# Patient Record
Sex: Female | Born: 1945
Health system: Southern US, Community
[De-identification: ages and names within clinical notes are randomized; demographics above are authoritative.]

## PROBLEM LIST (undated history)

## (undated) DIAGNOSIS — N183 Chronic kidney disease, stage 3 unspecified: Secondary | ICD-10-CM

## (undated) DIAGNOSIS — C801 Malignant (primary) neoplasm, unspecified: Secondary | ICD-10-CM

## (undated) DIAGNOSIS — M810 Age-related osteoporosis without current pathological fracture: Secondary | ICD-10-CM

## (undated) DIAGNOSIS — E785 Hyperlipidemia, unspecified: Secondary | ICD-10-CM

## (undated) HISTORY — DX: Chronic kidney disease, stage 3 (moderate): N18.3

## (undated) HISTORY — DX: Hyperlipidemia, unspecified: E78.5

## (undated) HISTORY — DX: Age-related osteoporosis without current pathological fracture: M81.0

## (undated) HISTORY — PX: COLON SURGERY: SHX602

## (undated) HISTORY — DX: Malignant (primary) neoplasm, unspecified: C80.1

## (undated) HISTORY — PX: TONSILLECTOMY: SUR1361

## (undated) HISTORY — DX: Chronic kidney disease, stage 3 unspecified: N18.30

## (undated) HISTORY — PX: ABDOMINAL HYSTERECTOMY: SHX81

---

## 2011-07-02 ENCOUNTER — Emergency Department (HOSPITAL_COMMUNITY): Payer: Medicare Other

## 2011-07-02 ENCOUNTER — Emergency Department (HOSPITAL_COMMUNITY)
Admission: EM | Admit: 2011-07-02 | Discharge: 2011-07-02 | Disposition: A | Discharge status: Home / Self Care | Payer: Medicare Other | Attending: Emergency Medicine | Admitting: Emergency Medicine

## 2011-07-02 DIAGNOSIS — R112 Nausea with vomiting, unspecified: Secondary | ICD-10-CM | POA: Insufficient documentation

## 2011-07-02 DIAGNOSIS — R279 Unspecified lack of coordination: Secondary | ICD-10-CM | POA: Insufficient documentation

## 2011-07-02 DIAGNOSIS — R61 Generalized hyperhidrosis: Secondary | ICD-10-CM | POA: Insufficient documentation

## 2011-07-02 DIAGNOSIS — H81399 Other peripheral vertigo, unspecified ear: Secondary | ICD-10-CM | POA: Insufficient documentation

## 2011-07-02 LAB — POCT I-STAT, CHEM 8
Calcium, Ion: 1.08 mmol/L — ABNORMAL LOW (ref 1.12–1.32)
Creatinine, Ser: 1.2 mg/dL — ABNORMAL HIGH (ref 0.50–1.10)
Glucose, Bld: 147 mg/dL — ABNORMAL HIGH (ref 70–99)
Hemoglobin: 12.6 g/dL (ref 12.0–15.0)
TCO2: 21 mmol/L (ref 0–100)

## 2011-07-02 LAB — LIPID PANEL
Cholesterol: 204 mg/dL — ABNORMAL HIGH (ref 0–200)
HDL: 67 mg/dL (ref >39)
Triglycerides: 109 mg/dL (ref <150)

## 2011-07-02 LAB — CBC
HCT: 37.8 % (ref 36.0–46.0)
Hemoglobin: 12.7 g/dL (ref 12.0–15.0)
MCH: 26.7 pg (ref 26.0–34.0)
MCHC: 33.6 g/dL (ref 30.0–36.0)
MCV: 79.4 fL (ref 78.0–100.0)

## 2011-07-02 LAB — DIFFERENTIAL
Eosinophils Relative: 1 % (ref 0–5)
Lymphocytes Relative: 29 % (ref 12–46)
Monocytes Absolute: 0.5 10*3/uL (ref 0.1–1.0)
Monocytes Relative: 7 % (ref 3–12)
Neutro Abs: 4.9 10*3/uL (ref 1.7–7.7)

## 2011-07-02 LAB — PROTIME-INR: Prothrombin Time: 13.7 seconds (ref 11.6–15.2)

## 2011-07-02 LAB — POCT I-STAT TROPONIN I: Troponin i, poc: 0.01 ng/mL (ref 0.00–0.08)

## 2011-07-02 LAB — APTT: aPTT: 27 seconds (ref 24–37)

## 2011-07-02 LAB — HEMOGLOBIN A1C: Mean Plasma Glucose: 128 mg/dL — ABNORMAL HIGH (ref <117)

## 2013-01-13 ENCOUNTER — Ambulatory Visit (INDEPENDENT_AMBULATORY_CARE_PROVIDER_SITE_OTHER): Payer: Medicare Other | Admitting: Family Medicine

## 2013-01-13 ENCOUNTER — Encounter: Payer: Self-pay | Admitting: Family Medicine

## 2013-01-13 VITALS — BP 140/80 | HR 60 | Temp 97.6°F | Resp 14 | Wt 158.0 lb

## 2013-01-13 DIAGNOSIS — M25561 Pain in right knee: Secondary | ICD-10-CM

## 2013-01-13 DIAGNOSIS — H8110 Benign paroxysmal vertigo, unspecified ear: Secondary | ICD-10-CM | POA: Insufficient documentation

## 2013-01-13 DIAGNOSIS — M25569 Pain in unspecified knee: Secondary | ICD-10-CM

## 2013-01-13 MED ORDER — MELOXICAM 15 MG PO TABS: 15.0000 mg | ORAL_TABLET | Freq: Every day | ORAL | Status: DC
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
## 2014-07-19 ENCOUNTER — Encounter: Payer: Self-pay | Admitting: Family Medicine

## 2014-07-19 ENCOUNTER — Ambulatory Visit (INDEPENDENT_AMBULATORY_CARE_PROVIDER_SITE_OTHER): Payer: Medicare Other | Admitting: Family Medicine

## 2014-07-19 VITALS — BP 150/82 | HR 64 | Temp 97.4°F | Resp 12 | Ht 66.0 in | Wt 159.0 lb

## 2014-07-19 DIAGNOSIS — R21 Rash and other nonspecific skin eruption: Secondary | ICD-10-CM

## 2014-07-19 MED ORDER — TRIAMCINOLONE ACETONIDE 0.1 % EX CREA: 1.0000 "application " | TOPICAL_CREAM | Freq: Two times a day (BID) | CUTANEOUS | Status: DC

## 2014-07-19 NOTE — Progress Notes (Signed)
Patient ID: Amy Powell, female   DOB: 04-Nov-1945, 68 y.o.   MRN: [id number]   Subjective:    Patient ID: Amy Powell, female    DOB: 01/02/1946, 68 y.o.   MRN: [id number]  Patient presents for Rash  Patient here with rash for the past week. Started with a couple of bumps on her legs and she knows a few bumps on her left shoulder blade and small red itchy bumps on her arms. The ones on her legs have resolved and once in her arms are pretty much gone she still has 3 bumps on her left shoulder blade which are very itchy. She did change her soap but otherwise nothing else is changed no sick contacts no rash feels well. She did try using an old anterior itch cream with minimal improvement   Review Of Systems:  GEN- denies fatigue, fever, weight loss,weakness, recent illness HEENT- denies eye drainage, change in vision, nasal discharge, CVS- denies chest pain, palpitations RESP- denies SOB, cough, wheeze ABD- denies N/V, change in stools, abd pain GU- denies dysuria, hematuria, dribbling, incontinence MSK- denies joint pain, muscle aches, injury Neuro- denies headache, dizziness, syncope, seizure activity       Objective:    BP 150/82  Pulse 64  Temp(Src) 97.4 F (36.3 C) (Oral)  Resp 12  Ht 5\' 6"  (1.676 m)  Wt 159 lb (72.122 kg)  BMI 25.68 kg/m2 GEN- NAD, alert and oriented x3 HEENT- MMM, oropharnx clear Skin- 3 midly erythematous papular lesions in semicircle left shoulder blade, small macupapular lesion on bilat forarm. No other lesions noted, no lesions on palms, no burrow lines noted Ext- no edema       Assessment & Plan:      Problem List Items Addressed This Visit   None    Visit Diagnoses   Rash and nonspecific skin eruption    -  Primary    Unclear cause appears to more like insect bites versus a true contact dermatitis. I've given her topical triamcinolone she will also use Benadryl as they're only 5 small visible lesoins.  If no improvement or it spreads  give Medrol dosepak.  Blood pressure was also elevated some today, she was very reluctant to come back in to have it rechecked, advised to check at home and call if elevated       Note: This dictation was prepared with Dragon dictation along with smaller phrase technology. Any transcriptional errors that result from this process are unintentional.

## 2014-07-19 NOTE — Patient Instructions (Addendum)
Benadryl at bedtime Apply Cream twice a day  Keep a check on your blood pressure if it running > 150/80 F/U as needed

## 2014-08-16 ENCOUNTER — Other Ambulatory Visit: Payer: Self-pay | Admitting: Family Medicine

## 2014-08-16 ENCOUNTER — Telehealth: Payer: Self-pay | Admitting: *Deleted

## 2014-08-16 DIAGNOSIS — Z1231 Encounter for screening mammogram for malignant neoplasm of breast: Secondary | ICD-10-CM

## 2014-08-16 NOTE — Telephone Encounter (Signed)
-----   Message from Roney Marion sent at 08/16/2014 12:53 PM EST ----- [facility]   Patient needs a mammogram.  Thank you, Larene Beach

## 2014-08-16 NOTE — Telephone Encounter (Signed)
Mammogram ordered

## 2014-08-21 ENCOUNTER — Encounter: Payer: Self-pay | Admitting: *Deleted

## 2014-08-30 ENCOUNTER — Other Ambulatory Visit: Payer: Self-pay | Admitting: Family Medicine

## 2014-08-30 DIAGNOSIS — Z1211 Encounter for screening for malignant neoplasm of colon: Secondary | ICD-10-CM

## 2014-08-31 ENCOUNTER — Telehealth: Payer: Self-pay | Admitting: *Deleted

## 2014-08-31 NOTE — Telephone Encounter (Signed)
Patient has appointment on  09/06/14 at 1:45pm with Dr. Benson Norway gastroenterologist for screening colonoscopy, left message for pt to return my call.

## 2015-02-16 ENCOUNTER — Other Ambulatory Visit: Payer: Self-pay

## 2015-02-16 DIAGNOSIS — E785 Hyperlipidemia, unspecified: Secondary | ICD-10-CM

## 2015-02-16 DIAGNOSIS — R7309 Other abnormal glucose: Secondary | ICD-10-CM

## 2015-02-16 DIAGNOSIS — Z0000 Encounter for general adult medical examination without abnormal findings: Secondary | ICD-10-CM

## 2015-02-16 LAB — COMPLETE METABOLIC PANEL WITH GFR
ALT: 9 U/L (ref 0–35)
AST: 13 U/L (ref 0–37)
Albumin: 3.6 g/dL (ref 3.5–5.2)
Alkaline Phosphatase: 69 U/L (ref 39–117)
BUN: 19 mg/dL (ref 6–23)
CALCIUM: 8.9 mg/dL (ref 8.4–10.5)
CHLORIDE: 108 meq/L (ref 96–112)
CO2: 26 meq/L (ref 19–32)
Creat: 1.09 mg/dL (ref 0.50–1.10)
GFR, EST AFRICAN AMERICAN: 60 mL/min
GFR, Est Non African American: 52 mL/min — ABNORMAL LOW
Glucose, Bld: 83 mg/dL (ref 70–99)
POTASSIUM: 4.7 meq/L (ref 3.5–5.3)
Sodium: 142 mEq/L (ref 135–145)
TOTAL PROTEIN: 6.5 g/dL (ref 6.0–8.3)
Total Bilirubin: 0.6 mg/dL (ref 0.2–1.2)

## 2015-02-16 LAB — LIPID PANEL
CHOL/HDL RATIO: 3.6 ratio
CHOLESTEROL: 186 mg/dL (ref 0–200)
HDL: 51 mg/dL (ref >=46)
LDL Cholesterol: 121 mg/dL — ABNORMAL HIGH (ref 0–99)
TRIGLYCERIDES: 71 mg/dL (ref <150)
VLDL: 14 mg/dL (ref 0–40)

## 2015-02-16 LAB — CBC WITH DIFFERENTIAL/PLATELET
Basophils Absolute: 0 10*3/uL (ref 0.0–0.1)
Basophils Relative: 0 % (ref 0–1)
Eosinophils Absolute: 0.1 10*3/uL (ref 0.0–0.7)
Eosinophils Relative: 2 % (ref 0–5)
HCT: 38.7 % (ref 36.0–46.0)
Hemoglobin: 12.6 g/dL (ref 12.0–15.0)
LYMPHS ABS: 2 10*3/uL (ref 0.7–4.0)
Lymphocytes Relative: 30 % (ref 12–46)
MCH: 25.9 pg — ABNORMAL LOW (ref 26.0–34.0)
MCHC: 32.6 g/dL (ref 30.0–36.0)
MCV: 79.6 fL (ref 78.0–100.0)
MONOS PCT: 8 % (ref 3–12)
MPV: 9.2 fL (ref 8.6–12.4)
Monocytes Absolute: 0.5 10*3/uL (ref 0.1–1.0)
NEUTROS PCT: 60 % (ref 43–77)
Neutro Abs: 4.1 10*3/uL (ref 1.7–7.7)
Platelets: 174 10*3/uL (ref 150–400)
RBC: 4.86 MIL/uL (ref 3.87–5.11)
RDW: 15.6 % — AB (ref 11.5–15.5)
WBC: 6.8 10*3/uL (ref 4.0–10.5)

## 2015-02-16 LAB — TSH: TSH: 1.172 u[IU]/mL (ref 0.350–4.500)

## 2015-02-17 LAB — HEMOGLOBIN A1C
HEMOGLOBIN A1C: 6.1 % — AB (ref <5.7)
Mean Plasma Glucose: 128 mg/dL — ABNORMAL HIGH (ref <117)

## 2015-02-23 ENCOUNTER — Ambulatory Visit (INDEPENDENT_AMBULATORY_CARE_PROVIDER_SITE_OTHER): Payer: Medicare HMO | Admitting: Family Medicine

## 2015-02-23 ENCOUNTER — Encounter: Payer: Self-pay | Admitting: Family Medicine

## 2015-02-23 VITALS — BP 140/76 | HR 68 | Temp 97.6°F | Resp 16 | Ht 66.0 in | Wt 147.0 lb

## 2015-02-23 DIAGNOSIS — Z1231 Encounter for screening mammogram for malignant neoplasm of breast: Secondary | ICD-10-CM

## 2015-02-23 DIAGNOSIS — Z0000 Encounter for general adult medical examination without abnormal findings: Secondary | ICD-10-CM

## 2015-02-23 NOTE — Progress Notes (Signed)
Subjective:    Patient ID: Amy Powell, female    DOB: 1946/02/24, 69 y.o.   MRN: [id number]  HPI  Patient is here for CPE.  She refuses a colonoscopy. She refuses a mammogram. She refuses a bone density test. She refuses shingles vaccine. She refuses Pneumovax 23. She refuses Prevnar 13.   Has a past medical history of a hysterectomy and therefore does not require a Pap smear or pelvic exam as she had a TAH/BSO.    No past medical history on file. Past Surgical History  Procedure Laterality Date  . Abdominal hysterectomy      for fibroids, TAHBSO    No current outpatient prescriptions on file prior to visit.   No current facility-administered medications on file prior to visit.   Allergies  Allergen Reactions  . Penicillins Itching   History   Social History  . Marital Status: Married    Spouse Name: N/A  . Number of Children: N/A  . Years of Education: N/A   Occupational History  . Not on file.   Social History Main Topics  . Smoking status: Never Smoker   . Smokeless tobacco: Never Used  . Alcohol Use: No  . Drug Use: No  . Sexual Activity: Not on file   Other Topics Concern  . Not on file   Social History Narrative    Family History  Problem Relation Age of Onset  . Hypertension Mother   . Stroke Mother   . Diabetes Mother   . Alcohol abuse Father   . Hypertension Father   . Hypertension Sister   . Diabetes Sister   . Heart disease Brother     Review of Systems  All other systems reviewed and are negative.      Objective:   Physical Exam  Constitutional: She is oriented to person, place, and time. She appears well-developed and well-nourished. No distress.  HENT:  Head: Normocephalic and atraumatic.  Right Ear: External ear normal.  Left Ear: External ear normal.  Nose: Nose normal.  Mouth/Throat: Oropharynx is clear and moist. No oropharyngeal exudate.  Eyes: Conjunctivae and EOM are normal. Pupils are equal, round, and reactive to  light. Right eye exhibits no discharge. Left eye exhibits no discharge. No scleral icterus.  Neck: Normal range of motion. Neck supple. No JVD present. No tracheal deviation present. No thyromegaly present.  Cardiovascular: Normal rate, regular rhythm, normal heart sounds and intact distal pulses.  Exam reveals no gallop and no friction rub.   No murmur heard. Pulmonary/Chest: Effort normal and breath sounds normal. No stridor. No respiratory distress. She has no wheezes. She has no rales. She exhibits no tenderness.  Abdominal: Soft. Bowel sounds are normal. She exhibits no distension and no mass. There is no tenderness. There is no rebound and no guarding.  Musculoskeletal: Normal range of motion. She exhibits no edema or tenderness.  Lymphadenopathy:    She has no cervical adenopathy.  Neurological: She is alert and oriented to person, place, and time. She has normal reflexes. She displays normal reflexes. No cranial nerve deficit. She exhibits normal muscle tone. Coordination normal.  Skin: Skin is warm. No rash noted. She is not diaphoretic. No erythema. No pallor.  Psychiatric: She has a normal mood and affect. Her behavior is normal. Judgment and thought content normal.  Vitals reviewed.  Lab on 02/16/2015  Component Date Value Ref Range Status  . Sodium 02/16/2015 142  135 - 145 mEq/L Final  . Potassium 02/16/2015  4.7  3.5 - 5.3 mEq/L Final  . Chloride 02/16/2015 108  96 - 112 mEq/L Final  . CO2 02/16/2015 26  19 - 32 mEq/L Final  . Glucose, Bld 02/16/2015 83  70 - 99 mg/dL Final  . BUN 02/16/2015 19  6 - 23 mg/dL Final  . Creat 02/16/2015 1.09  0.50 - 1.10 mg/dL Final  . Total Bilirubin 02/16/2015 0.6  0.2 - 1.2 mg/dL Final  . Alkaline Phosphatase 02/16/2015 69  39 - 117 U/L Final  . AST 02/16/2015 13  0 - 37 U/L Final  . ALT 02/16/2015 9  0 - 35 U/L Final  . Total Protein 02/16/2015 6.5  6.0 - 8.3 g/dL Final  . Albumin 02/16/2015 3.6  3.5 - 5.2 g/dL Final  . Calcium 02/16/2015  8.9  8.4 - 10.5 mg/dL Final  . GFR, Est African American 02/16/2015 60   Final  . GFR, Est Non African American 02/16/2015 52*  Final   Comment:   The estimated GFR is a calculation valid for adults (>=70 years old) that uses the CKD-EPI algorithm to adjust for age and sex. It is   not to be used for children, pregnant women, hospitalized patients,    patients on dialysis, or with rapidly changing kidney function. According to the NKDEP, eGFR >89 is normal, 60-89 shows mild impairment, 30-59 shows moderate impairment, 15-29 shows severe impairment and <15 is ESRD.     . TSH 02/16/2015 1.172  0.350 - 4.500 uIU/mL Final  . Cholesterol 02/16/2015 186  0 - 200 mg/dL Final   Comment: ATP III Classification:       < 200        mg/dL        Desirable      200 - 239     mg/dL        Borderline High      >= 240        mg/dL        High     . Triglycerides 02/16/2015 71  <150 mg/dL Final  . HDL 02/16/2015 51  >=46 mg/dL Final   ** Please note change in reference range(s). **  . Total CHOL/HDL Ratio 02/16/2015 3.6   Final  . VLDL 02/16/2015 14  0 - 40 mg/dL Final  . LDL Cholesterol 02/16/2015 121* 0 - 99 mg/dL Final   Comment:   Total Cholesterol/HDL Ratio:CHD Risk                        Coronary Heart Disease Risk Table                                        Men       Women          1/2 Average Risk              3.4        3.3              Average Risk              5.0        4.4           2X Average Risk              9.6        7.1  3X Average Risk             23.4       11.0 Use the calculated Patient Ratio above and the CHD Risk table  to determine the patient's CHD Risk. ATP III Classification (LDL):       < 100        mg/dL         Optimal      100 - 129     mg/dL         Near or Above Optimal      130 - 159     mg/dL         Borderline High      160 - 189     mg/dL         High       > 190        mg/dL         Very High     . WBC 02/16/2015 6.8  4.0 - 10.5 K/uL Final   . RBC 02/16/2015 4.86  3.87 - 5.11 MIL/uL Final  . Hemoglobin 02/16/2015 12.6  12.0 - 15.0 g/dL Final  . HCT 02/16/2015 38.7  36.0 - 46.0 % Final  . MCV 02/16/2015 79.6  78.0 - 100.0 fL Final  . MCH 02/16/2015 25.9* 26.0 - 34.0 pg Final  . MCHC 02/16/2015 32.6  30.0 - 36.0 g/dL Final  . RDW 02/16/2015 15.6* 11.5 - 15.5 % Final  . Platelets 02/16/2015 174  150 - 400 K/uL Final  . MPV 02/16/2015 9.2  8.6 - 12.4 fL Final  . Neutrophils Relative % 02/16/2015 60  43 - 77 % Final  . Neutro Abs 02/16/2015 4.1  1.7 - 7.7 K/uL Final  . Lymphocytes Relative 02/16/2015 30  12 - 46 % Final  . Lymphs Abs 02/16/2015 2.0  0.7 - 4.0 K/uL Final  . Monocytes Relative 02/16/2015 8  3 - 12 % Final  . Monocytes Absolute 02/16/2015 0.5  0.1 - 1.0 K/uL Final  . Eosinophils Relative 02/16/2015 2  0 - 5 % Final  . Eosinophils Absolute 02/16/2015 0.1  0.0 - 0.7 K/uL Final  . Basophils Relative 02/16/2015 0  0 - 1 % Final  . Basophils Absolute 02/16/2015 0.0  0.0 - 0.1 K/uL Final  . Smear Review 02/16/2015 Criteria for review not met   Final  . Hgb A1c MFr Bld 02/16/2015 6.1* <5.7 % Final   Comment:                                                                        According to the ADA Clinical Practice Recommendations for 2011, when HbA1c is used as a screening test:     >=6.5%   Diagnostic of Diabetes Mellitus            (if abnormal result is confirmed)   5.7-6.4%   Increased risk of developing Diabetes Mellitus   References:Diagnosis and Classification of Diabetes Mellitus,Diabetes VXBL,3903,00(PQZRA 1):S62-S69 and Standards of Medical Care in         Diabetes - 2011,Diabetes QTMA,2633,35 (Suppl 1):S11-S61.     . Mean Plasma Glucose 02/16/2015 128* <117 mg/dL Final  Assessment & Plan:  Routine general medical examination at a health care facility - Plan: CBC with Differential/Platelet, COMPLETE METABOLIC PANEL WITH GFR, Lipid panel, TSH, DG Bone Density  Screening mammogram for  high-risk patient - Plan: MM Digital Screening Patient refuses colonosocopy and even stool cards.  SHe will consent to mammogram and DEXA which I willl shcedule.  Refuses all vaccinations.  Labwork is excellent.  Decrease carbs in diet and recheck in 1 year.  Add calcium and vitamin D.

## 2015-03-07 ENCOUNTER — Encounter: Payer: Self-pay | Admitting: *Deleted

## 2015-03-15 ENCOUNTER — Other Ambulatory Visit: Payer: Self-pay | Admitting: Family Medicine

## 2015-03-15 DIAGNOSIS — Z1231 Encounter for screening mammogram for malignant neoplasm of breast: Secondary | ICD-10-CM

## 2015-03-15 DIAGNOSIS — Z780 Asymptomatic menopausal state: Secondary | ICD-10-CM

## 2015-03-28 ENCOUNTER — Encounter: Payer: Self-pay | Admitting: *Deleted

## 2015-04-03 ENCOUNTER — Other Ambulatory Visit: Payer: Self-pay | Admitting: Family Medicine

## 2015-04-03 DIAGNOSIS — E2839 Other primary ovarian failure: Secondary | ICD-10-CM

## 2015-04-12 ENCOUNTER — Ambulatory Visit
Admission: RE | Admit: 2015-04-12 | Discharge: 2015-04-12 | Disposition: A | Discharge status: Home / Self Care | Payer: Medicare HMO | Source: Ambulatory Visit | Attending: Family Medicine | Admitting: Family Medicine

## 2015-04-12 DIAGNOSIS — Z1231 Encounter for screening mammogram for malignant neoplasm of breast: Secondary | ICD-10-CM

## 2016-10-07 ENCOUNTER — Telehealth: Payer: Self-pay | Admitting: Family Medicine

## 2016-10-07 NOTE — Telephone Encounter (Signed)
Please call pt to schedule appt. She left a message on vm. Looks like she needs CPE.

## 2016-10-09 ENCOUNTER — Ambulatory Visit (INDEPENDENT_AMBULATORY_CARE_PROVIDER_SITE_OTHER): Payer: Medicare HMO | Admitting: Family Medicine

## 2016-10-09 ENCOUNTER — Encounter: Payer: Self-pay | Admitting: Family Medicine

## 2016-10-09 VITALS — BP 150/84 | HR 76 | Temp 97.8°F | Resp 14 | Ht 66.0 in | Wt 152.0 lb

## 2016-10-09 DIAGNOSIS — N6320 Unspecified lump in the left breast, unspecified quadrant: Secondary | ICD-10-CM | POA: Diagnosis not present

## 2016-10-09 NOTE — Progress Notes (Signed)
   Subjective:    Patient ID: Amy Powell, female    DOB: [birth year], 71 y.o.   MRN: [id number]  HPI Patient states that for the last month she has felt a lump in her left breast. She reports that she feels the lump at approximately 4:00. She states that it's mildly tender. On clinical breast exam today I am unable to appreciate any mass. Patient is unable to locate the mass herself or pinpoint it for me to feel. Last mammogram was July 2016  No past medical history on file. Past Surgical History:  Procedure Laterality Date  . ABDOMINAL HYSTERECTOMY     for fibroids, TAHBSO   No current outpatient prescriptions on file prior to visit.   No current facility-administered medications on file prior to visit.    Allergies  Allergen Reactions  . Penicillins Itching   Social History   Social History  . Marital status: Married    Spouse name: N/A  . Number of children: N/A  . Years of education: N/A   Occupational History  . Not on file.   Social History Main Topics  . Smoking status: Never Smoker  . Smokeless tobacco: Never Used  . Alcohol use Yes  . Drug use: No  . Sexual activity: Not on file     Comment: Works at [employer].   Other Topics Concern  . Not on file   Social History Narrative  . No narrative on file      Review of Systems  All other systems reviewed and are negative.      Objective:   Physical Exam  Cardiovascular: Normal rate, regular rhythm and normal heart sounds.   Pulmonary/Chest: Effort normal and breath sounds normal.  Vitals reviewed.         Assessment & Plan:  Left breast mass - Plan: MM Digital Diagnostic Unilat L  Despite my best effort I am unable to appreciate any mass in her left breast today. This is reassuring. However given her age, she certainly due for mammogram. Therefore I will order a diagnostic mammogram of the left breast. Also perform a screening mammogram of the right breast at that time. I will schedule this as  soon as possible however fortunately today I do not appreciate any abnormality on her exam

## 2016-10-13 ENCOUNTER — Other Ambulatory Visit: Payer: Self-pay | Admitting: Family Medicine

## 2016-10-13 DIAGNOSIS — N6320 Unspecified lump in the left breast, unspecified quadrant: Secondary | ICD-10-CM

## 2016-10-15 ENCOUNTER — Other Ambulatory Visit: Payer: Self-pay

## 2016-10-23 ENCOUNTER — Ambulatory Visit
Admission: RE | Admit: 2016-10-23 | Discharge: 2016-10-23 | Disposition: A | Discharge status: Home / Self Care | Payer: Medicare HMO | Source: Ambulatory Visit | Attending: Family Medicine | Admitting: Family Medicine

## 2016-10-23 DIAGNOSIS — N6321 Unspecified lump in the left breast, upper outer quadrant: Secondary | ICD-10-CM | POA: Diagnosis not present

## 2016-10-23 DIAGNOSIS — N6320 Unspecified lump in the left breast, unspecified quadrant: Secondary | ICD-10-CM

## 2017-03-09 DIAGNOSIS — Z972 Presence of dental prosthetic device (complete) (partial): Secondary | ICD-10-CM | POA: Diagnosis not present

## 2017-03-09 DIAGNOSIS — Z7982 Long term (current) use of aspirin: Secondary | ICD-10-CM | POA: Diagnosis not present

## 2017-03-09 DIAGNOSIS — K08109 Complete loss of teeth, unspecified cause, unspecified class: Secondary | ICD-10-CM | POA: Diagnosis not present

## 2017-03-09 DIAGNOSIS — Z0000 Encounter for general adult medical examination without abnormal findings: Secondary | ICD-10-CM | POA: Diagnosis not present

## 2017-03-09 DIAGNOSIS — Z6824 Body mass index (BMI) 24.0-24.9, adult: Secondary | ICD-10-CM | POA: Diagnosis not present

## 2017-03-16 ENCOUNTER — Ambulatory Visit (INDEPENDENT_AMBULATORY_CARE_PROVIDER_SITE_OTHER): Payer: Medicare HMO | Admitting: Family Medicine

## 2017-03-16 ENCOUNTER — Encounter: Payer: Self-pay | Admitting: Family Medicine

## 2017-03-16 VITALS — BP 150/92 | HR 74 | Temp 97.9°F | Resp 16 | Ht 66.0 in | Wt 148.0 lb

## 2017-03-16 DIAGNOSIS — T7840XA Allergy, unspecified, initial encounter: Secondary | ICD-10-CM

## 2017-03-16 MED ORDER — MOMETASONE FUROATE 0.1 % EX OINT: TOPICAL_OINTMENT | Freq: Every day | CUTANEOUS | 0 refills | Status: DC

## 2017-03-16 NOTE — Progress Notes (Signed)
   Subjective:    Patient ID: Amy Powell, female    DOB: [birth year], 71 y.o.   MRN: [id number]  HPI Patient was bitten by some type of insect on the posterior medial aspect of her left calf.  There is an erythematous plaque in that area now that is approximately 2 cm x 3 cm elliptical in shape. There is a clear 4 mm vesicle. There are tiny small 1-2 mm vesicles are coalescent on this one plaque. There is erythema. It itches. It seems to be experiencing allergic reaction to the insect bite. She is certain that it was not a tick or a spider. She saw the insect and removed it herself.  No past medical history on file. Past Surgical History:  Procedure Laterality Date  . ABDOMINAL HYSTERECTOMY     for fibroids, TAHBSO   No current outpatient prescriptions on file prior to visit.   No current facility-administered medications on file prior to visit.    Allergies  Allergen Reactions  . Penicillins Itching   Social History   Social History  . Marital status: Married    Spouse name: N/A  . Number of children: N/A  . Years of education: N/A   Occupational History  . Not on file.   Social History Main Topics  . Smoking status: Never Smoker  . Smokeless tobacco: Never Used  . Alcohol use Yes  . Drug use: No  . Sexual activity: Not on file     Comment: Works at [employer].   Other Topics Concern  . Not on file   Social History Narrative  . No narrative on file    Review of Systems  All other systems reviewed and are negative.      Objective:   Physical Exam  Cardiovascular: Normal rate and normal heart sounds.   Pulmonary/Chest: Effort normal and breath sounds normal.  Musculoskeletal:       Legs: Skin: Rash noted. There is erythema.  Vitals reviewed.         Assessment & Plan:  Allergic reaction, initial encounter - Plan: mometasone (ELOCON) 0.1 % ointment  Patient appears to be having allergic reaction to an insect bite/local toxic reaction to an insect  bite. The erythema and vesicular eruption demonstrate inflammation but not infection. Will treat with Elocon ointment applied once daily for 1 week. If the redness spreads, add doxycycline to cover for cellulitis or possible tickborne illnesses in case the patient was mistaken in her identity of the insect

## 2017-08-31 ENCOUNTER — Ambulatory Visit: Payer: Medicare HMO | Admitting: Podiatry

## 2017-08-31 ENCOUNTER — Ambulatory Visit (INDEPENDENT_AMBULATORY_CARE_PROVIDER_SITE_OTHER): Payer: Medicare HMO

## 2017-08-31 ENCOUNTER — Encounter: Payer: Self-pay | Admitting: Podiatry

## 2017-08-31 VITALS — BP 161/82 | HR 67 | Resp 16

## 2017-08-31 DIAGNOSIS — L84 Corns and callosities: Secondary | ICD-10-CM | POA: Diagnosis not present

## 2017-08-31 DIAGNOSIS — M21622 Bunionette of left foot: Secondary | ICD-10-CM

## 2017-08-31 DIAGNOSIS — M2010 Hallux valgus (acquired), unspecified foot: Secondary | ICD-10-CM

## 2017-08-31 DIAGNOSIS — M216X9 Other acquired deformities of unspecified foot: Secondary | ICD-10-CM | POA: Diagnosis not present

## 2017-08-31 DIAGNOSIS — M21621 Bunionette of right foot: Secondary | ICD-10-CM | POA: Diagnosis not present

## 2017-08-31 NOTE — Progress Notes (Signed)
   Subjective:    Patient ID: Amy Powell, female    DOB: [birth year], 72 y.o.   MRN: [id number]  HPI    Review of Systems  All other systems reviewed and are negative.      Objective:   Physical Exam        Assessment & Plan:

## 2017-08-31 NOTE — Patient Instructions (Signed)

## 2017-08-31 NOTE — Progress Notes (Signed)
Subjective:   Patient ID: Amy Powell, female   DOB: 71 y.o.   MRN: [id number]   HPI Patient presents stating that she is having a lot of pain in her right over left foot and she has lesions underneath that are also very sore. States that she has tried to reduce her weightbearing on this and has tried different modalities without relief and no she needs something done long-term. Patient states that she's tried trimming she's tried padding lesions and wider shoe gear   Review of Systems  All other systems reviewed and are negative.       Objective:  Physical Exam  Constitutional: She appears well-developed and well-nourished.  Cardiovascular: Intact distal pulses.  Pulmonary/Chest: Effort normal.  Musculoskeletal: Normal range of motion.  Neurological: She is alert.  Skin: Skin is warm.  Nursing note and vitals reviewed.  Neurovascular status found to be intact with muscle strength adequate range of motion within normal limits. Patient is noted to have large hyperostosis medial aspect first metatarsal head right with redness and pain elevated rigid contracture digits 23 right and severe keratotic lesion sub-second and fifth metatarsal the right foot. 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 Discussed everything as outlined and reviewed causes and today deep debridement accomplished which gave temporary relief. Patient would like a more permanent procedure and I recommended a distal osteotomy right first metatarsal along with digital  fusion procedures elevating osteotomy and fifth metatarsal head resection. She will have this done in January and will reappoint to discuss at that time  X-rays taken today revealed elevation of the 1 to intermetatarsal angle of approximate 15 bilateral with elevated lesser digits

## 2017-09-16 ENCOUNTER — Other Ambulatory Visit: Payer: Self-pay | Admitting: Family Medicine

## 2017-09-16 DIAGNOSIS — Z139 Encounter for screening, unspecified: Secondary | ICD-10-CM

## 2017-10-05 ENCOUNTER — Encounter: Payer: Self-pay | Admitting: Podiatry

## 2017-10-05 ENCOUNTER — Ambulatory Visit: Payer: Medicare HMO | Admitting: Podiatry

## 2017-10-05 DIAGNOSIS — M21621 Bunionette of right foot: Secondary | ICD-10-CM

## 2017-10-05 DIAGNOSIS — M216X9 Other acquired deformities of unspecified foot: Secondary | ICD-10-CM

## 2017-10-05 DIAGNOSIS — M21622 Bunionette of left foot: Secondary | ICD-10-CM | POA: Diagnosis not present

## 2017-10-05 DIAGNOSIS — M2010 Hallux valgus (acquired), unspecified foot: Secondary | ICD-10-CM | POA: Diagnosis not present

## 2017-10-05 NOTE — Patient Instructions (Signed)
Pre-Operative Instructions  Congratulations, you have decided to take an important step towards improving your quality of life.  You can be assured that the doctors and staff at [facility] will be with you every step of the way.  Here are some important things you should know:  1. Plan to be at the surgery center/hospital at least 1 (one) hour prior to your scheduled time, unless otherwise directed by the surgical center/hospital staff.  You must have a responsible adult accompany you, remain during the surgery and drive you home.  Make sure you have directions to the surgical center/hospital to ensure you arrive on time. 2. If you are having surgery at [facility] or [facility] hospitals, you will need a copy of your medical history and physical form from your family physician within one month prior to the date of surgery. We will give you a form for your primary physician to complete.  3. We make every effort to accommodate the date you request for surgery.  However, there are times where surgery dates or times have to be moved.  We will contact you as soon as possible if a change in schedule is required.   4. No aspirin/ibuprofen for one week before surgery.  If you are on aspirin, any non-steroidal anti-inflammatory medications (Mobic, Aleve, Ibuprofen) should not be taken seven (7) days prior to your surgery.  You make take Tylenol for pain prior to surgery.  5. Medications - If you are taking daily heart and blood pressure medications, seizure, reflux, allergy, asthma, anxiety, pain or diabetes medications, make sure you notify the surgery center/hospital before the day of surgery so they can tell you which medications you should take or avoid the day of surgery. 6. No food or drink after midnight the night before surgery unless directed otherwise by surgical center/hospital staff. 7. No alcoholic beverages 24-hours prior to surgery.  No smoking 24-hours prior or 24-hours after  surgery. 8. Wear loose pants or shorts. They should be loose enough to fit over bandages, boots, and casts. 9. Don't wear slip-on shoes. Sneakers are preferred. 10. Bring your boot with you to the surgery center/hospital.  Also bring crutches or a walker if your physician has prescribed it for you.  If you do not have this equipment, it will be provided for you after surgery. 11. If you have not been contacted by the surgery center/hospital by the day before your surgery, call to confirm the date and time of your surgery. 12. Leave-time from work may vary depending on the type of surgery you have.  Appropriate arrangements should be made prior to surgery with your employer. 13. Prescriptions will be provided immediately following surgery by your doctor.  Fill these as soon as possible after surgery and take the medication as directed. Pain medications will not be refilled on weekends and must be approved by the doctor. 14. Remove nail polish on the operative foot and avoid getting pedicures prior to surgery. 15. Wash the night before surgery.  The night before surgery wash the foot and leg well with water and the antibacterial soap provided. Be sure to pay special attention to beneath the toenails and in between the toes.  Wash for at least three (3) minutes. Rinse thoroughly with water and dry well with a towel.  Perform this wash unless told not to do so by your physician.  Enclosed: 1 Ice pack (please put in freezer the night before surgery)   1 Hibiclens skin cleaner     Pre-op instructions  If you have any questions regarding the instructions, please do not hesitate to call our office.  [place]: [address], , [place] [address] -- [phone]  [place]: [address] [place], [place] [address] -- [phone]  [place]: [address]  [place], [place] [address] -- [phone]  [place]: [address], [address], [place],  [address] -- [phone]  Website:  [email or web address] 

## 2017-10-08 ENCOUNTER — Telehealth: Payer: Self-pay | Admitting: *Deleted

## 2017-10-08 NOTE — Telephone Encounter (Signed)
"  I just want to know what time they're supposed to schedule my surgery."

## 2017-10-08 NOTE — Telephone Encounter (Signed)
I'm returning your call.  How can I help you?  "I already spoke to someone, you all don't talk."  I don't know who you may have spoken to.  "They called me and asked me all the questions and told me what time to be there."  That was probably someone from the surgical center.  I am calling from the [facility].  You had left me a message.  "Oh okay, answered my question.  Thank you for calling me back."

## 2017-10-20 ENCOUNTER — Telehealth: Payer: Self-pay | Admitting: *Deleted

## 2017-10-20 ENCOUNTER — Encounter: Payer: Self-pay | Admitting: Podiatry

## 2017-10-20 DIAGNOSIS — M216X1 Other acquired deformities of right foot: Secondary | ICD-10-CM | POA: Diagnosis not present

## 2017-10-20 DIAGNOSIS — M21611 Bunion of right foot: Secondary | ICD-10-CM | POA: Diagnosis not present

## 2017-10-20 DIAGNOSIS — M25771 Osteophyte, right ankle: Secondary | ICD-10-CM | POA: Diagnosis not present

## 2017-10-20 DIAGNOSIS — M21541 Acquired clubfoot, right foot: Secondary | ICD-10-CM | POA: Diagnosis not present

## 2017-10-20 DIAGNOSIS — M2011 Hallux valgus (acquired), right foot: Secondary | ICD-10-CM | POA: Diagnosis not present

## 2017-10-20 DIAGNOSIS — M25571 Pain in right ankle and joints of right foot: Secondary | ICD-10-CM | POA: Diagnosis not present

## 2017-10-20 DIAGNOSIS — M21621 Bunionette of right foot: Secondary | ICD-10-CM | POA: Diagnosis not present

## 2017-10-20 DIAGNOSIS — M2041 Other hammer toe(s) (acquired), right foot: Secondary | ICD-10-CM | POA: Diagnosis not present

## 2017-10-20 NOTE — Telephone Encounter (Signed)
Derrel Nip 7289 states Dr. Paulla Dolly wrote Amy Powell 10/325 5mEq of Morphine, only 54mEq is allowed for a 1st time narcotic user. Melanie recommended Percocet 10/325mg  tid. Dr. Josephina Shih the change to Percocet 10/325mg  tid.

## 2017-10-22 ENCOUNTER — Telehealth: Payer: Self-pay | Admitting: *Deleted

## 2017-10-22 NOTE — Telephone Encounter (Signed)
Pt states she just noticed that her foot is bleeding from the surgery Tuesday. I asked pt and she said it was bright red, and about the width and length of a finger at the top edge of the ace wrap. I told pt to get off of the foot, rest, ice and elevate and we would see her 8:15am tomorrow.

## 2017-10-23 ENCOUNTER — Encounter: Payer: Self-pay | Admitting: Podiatry

## 2017-10-23 ENCOUNTER — Ambulatory Visit (INDEPENDENT_AMBULATORY_CARE_PROVIDER_SITE_OTHER): Payer: Medicare HMO | Admitting: Podiatry

## 2017-10-23 DIAGNOSIS — M2010 Hallux valgus (acquired), unspecified foot: Secondary | ICD-10-CM | POA: Diagnosis not present

## 2017-10-23 DIAGNOSIS — M21622 Bunionette of left foot: Secondary | ICD-10-CM

## 2017-10-23 DIAGNOSIS — M21621 Bunionette of right foot: Secondary | ICD-10-CM

## 2017-10-23 NOTE — Progress Notes (Signed)
Subjective:   Patient ID: [name], female   DOB: 72 y.o.   MRN: [id number]   HPI Patient presents 3 days after surgery just concerned because she still has some bleeding and she wants to make sure it is okay   ROS      Objective:  Physical Exam  Neurovascular status was intact negative Homans sign was noted with patient's dressing showed crusted blood with no current fresh blood formation on the dressing itself     Assessment:  Appears to be doing fine     Plan:  Took partial dressing off but leaving the under line dressing and applied sterile Ace wrap back to the area.  Patient will be seen back for regular visit next week or earlier if needed

## 2017-10-26 ENCOUNTER — Other Ambulatory Visit: Payer: Medicare HMO

## 2017-10-26 ENCOUNTER — Ambulatory Visit
Admission: RE | Admit: 2017-10-26 | Discharge: 2017-10-26 | Disposition: A | Discharge status: Home / Self Care | Payer: Medicare HMO | Source: Ambulatory Visit | Attending: Family Medicine | Admitting: Family Medicine

## 2017-10-26 DIAGNOSIS — Z1231 Encounter for screening mammogram for malignant neoplasm of breast: Secondary | ICD-10-CM | POA: Diagnosis not present

## 2017-10-26 DIAGNOSIS — Z139 Encounter for screening, unspecified: Secondary | ICD-10-CM

## 2017-10-28 ENCOUNTER — Ambulatory Visit (INDEPENDENT_AMBULATORY_CARE_PROVIDER_SITE_OTHER): Payer: Medicare HMO | Admitting: Podiatry

## 2017-10-28 ENCOUNTER — Ambulatory Visit (INDEPENDENT_AMBULATORY_CARE_PROVIDER_SITE_OTHER): Payer: Medicare HMO

## 2017-10-28 ENCOUNTER — Encounter: Payer: Self-pay | Admitting: Podiatry

## 2017-10-28 VITALS — BP 124/71 | HR 78 | Temp 97.5°F

## 2017-10-28 DIAGNOSIS — M21621 Bunionette of right foot: Secondary | ICD-10-CM

## 2017-10-28 DIAGNOSIS — M216X9 Other acquired deformities of unspecified foot: Secondary | ICD-10-CM

## 2017-10-28 DIAGNOSIS — M2010 Hallux valgus (acquired), unspecified foot: Secondary | ICD-10-CM

## 2017-10-28 DIAGNOSIS — M21622 Bunionette of left foot: Secondary | ICD-10-CM | POA: Diagnosis not present

## 2017-10-28 NOTE — Progress Notes (Signed)
Subjective:   Patient ID: Amy Powell, female   DOB: 72 y.o.   MRN: [id number]   HPI Patient states she is doing really well with minimal discomfort and good alignment of the toes    ROS      Objective:  Physical Exam  Neurovascular status intact negative Homans sign noted with wound edges well coapted digits in good alignment and osteotomy is healing well.     Assessment:  Doing well post forefoot reconstruction right     Plan:  H&P condition reviewed and recommended continued elevation compression immobilization and patient will be seen back for [place] to recheck again in 2 weeks or earlier if needed  X-rays indicate pins are in good alignment metatarsal in good alignment with good positional component noted

## 2017-11-02 NOTE — Progress Notes (Signed)
DOS 10/20/17 [place] Bunionectomy Rt., Metatarsal osteotomy 2nd Rt,  Pan-metatarsal head resection 5th Rt., Hammertoe repair 2,3 w/ pin Rt.

## 2017-11-11 ENCOUNTER — Ambulatory Visit (INDEPENDENT_AMBULATORY_CARE_PROVIDER_SITE_OTHER): Payer: Medicare HMO | Admitting: Podiatry

## 2017-11-11 ENCOUNTER — Ambulatory Visit (INDEPENDENT_AMBULATORY_CARE_PROVIDER_SITE_OTHER): Payer: Medicare HMO

## 2017-11-11 DIAGNOSIS — M216X9 Other acquired deformities of unspecified foot: Secondary | ICD-10-CM

## 2017-11-11 DIAGNOSIS — M21621 Bunionette of right foot: Secondary | ICD-10-CM

## 2017-11-11 DIAGNOSIS — M21622 Bunionette of left foot: Secondary | ICD-10-CM

## 2017-11-11 DIAGNOSIS — M2010 Hallux valgus (acquired), unspecified foot: Secondary | ICD-10-CM | POA: Diagnosis not present

## 2017-11-12 NOTE — Progress Notes (Signed)
Subjective:   Patient ID: Amy Powell, female   DOB: 72 y.o.   MRN: [id number]   HPI Patient states overall doing well with the right foot with the second toe being slightly elevated in its position   ROS      Objective:  Physical Exam  Neurovascular status intact with stitches intact in the second third metatarsal fifth metatarsal and first metatarsal secondary to extensive forefoot reconstruction diminishment of discomfort with moderate elevation of the second digit noted     Assessment:  Patient is doing well overall with moderate elevation second toe with pins in place second third toe right     Plan:  H&P x-ray reviewed stitches are removed and I dispensed a BioSkin brace of ankle in order to control swelling and lower the second toe.  Patient will return in 2 weeks for suture pin removal or earlier if any issues should occur and she was advised on continued immobilization compression elevation  X-rays indicate that there is satisfactory alignment of the digit with moderate elevation of the second toe

## 2017-12-11 ENCOUNTER — Telehealth: Payer: Self-pay | Admitting: Podiatry

## 2017-12-11 NOTE — Telephone Encounter (Signed)
Pt. Called because she wants the pins taken out, because they are painful. I tried to schedule appt. So he could take a  Look but she wanted you to call her back.

## 2017-12-11 NOTE — Telephone Encounter (Signed)
I spoke to pt, she states the pin is turning in her foot and she wanted to know when it would come out. I told pt that generally the pin would be removed about 4-6 weeks post-op about this period of time. I told pt if the pin was spinning it was about time to remove it in office, to decrease her activity and if the pin did move out or come completely out, to cover the area with an antibiotic ointment bandaid and call the office. Pt states understanding and I transferred to schedulers.

## 2017-12-15 DIAGNOSIS — Z880 Allergy status to penicillin: Secondary | ICD-10-CM | POA: Diagnosis not present

## 2017-12-15 DIAGNOSIS — R030 Elevated blood-pressure reading, without diagnosis of hypertension: Secondary | ICD-10-CM | POA: Diagnosis not present

## 2017-12-15 DIAGNOSIS — Z8249 Family history of ischemic heart disease and other diseases of the circulatory system: Secondary | ICD-10-CM | POA: Diagnosis not present

## 2017-12-15 DIAGNOSIS — Z833 Family history of diabetes mellitus: Secondary | ICD-10-CM | POA: Diagnosis not present

## 2017-12-17 ENCOUNTER — Ambulatory Visit (INDEPENDENT_AMBULATORY_CARE_PROVIDER_SITE_OTHER): Payer: Medicare HMO | Admitting: Podiatry

## 2017-12-17 ENCOUNTER — Ambulatory Visit (INDEPENDENT_AMBULATORY_CARE_PROVIDER_SITE_OTHER): Payer: Medicare HMO

## 2017-12-17 ENCOUNTER — Encounter: Payer: Self-pay | Admitting: Podiatry

## 2017-12-17 DIAGNOSIS — M2041 Other hammer toe(s) (acquired), right foot: Secondary | ICD-10-CM | POA: Diagnosis not present

## 2017-12-17 NOTE — Progress Notes (Signed)
Subjective:   Patient ID: Amy Powell, female   DOB: 72 y.o.   MRN: [id number]   HPI Patient presents stating doing very well with minimal discomfort and ready to get the pins out of her toes   ROS      Objective:  Physical Exam  Neurovascular status intact with pins in place second and third digits right with good alignment noted and there is elevation of the second toe secondary to compression from the big toe third toe but it is relatively static in its appearance     Assessment:  This appears to be due to the structural position of the third and second digit which naturally come towards a second visit with good alignment overall in good position of the second and third toes from the sagittal plane     Plan:  X-rays reviewed and pins taken out first with sterile dressings applied.  At this point continue to lower the second toe and I discussed range of motion exercises gradual return to soft shoe and I dispensed ankle compression stocking.  Reappoint for [place] to recheck  X-ray indicates that the osteotomies are healing well with good structural alignment noted

## 2018-01-27 ENCOUNTER — Ambulatory Visit: Payer: Medicare HMO | Admitting: Podiatry

## 2018-01-27 ENCOUNTER — Encounter: Payer: Self-pay | Admitting: Podiatry

## 2018-01-27 ENCOUNTER — Ambulatory Visit (INDEPENDENT_AMBULATORY_CARE_PROVIDER_SITE_OTHER): Payer: Medicare HMO

## 2018-01-27 DIAGNOSIS — M2041 Other hammer toe(s) (acquired), right foot: Secondary | ICD-10-CM | POA: Diagnosis not present

## 2018-01-27 DIAGNOSIS — M2010 Hallux valgus (acquired), unspecified foot: Secondary | ICD-10-CM

## 2018-01-27 NOTE — Progress Notes (Signed)
Subjective:   Patient ID: Amy Powell, female   DOB: 72 y.o.   MRN: [id number]   HPI Patient states having no pain in the right foot with everything in good alignment   ROS      Objective:  Physical Exam  Neurovascular status intact with patient's right foot doing well with minimal discomfort and keratotic lesion resolving plantar surface     Assessment:  Doing excellent clinically with moderate structural elevation of the second toe and moderate recurrent bunion deformity but much better than previous with diminishment of chronic keratotic lesion formation     Plan:  Reviewed x-rays discharge patient and gave strict instructions to be seen back if any issues should occur  X-rays indicate the osteotomy has healed well the digit well mildly elevated is in good position and the fifth metatarsal head satisfactory resected

## 2018-02-24 ENCOUNTER — Ambulatory Visit (INDEPENDENT_AMBULATORY_CARE_PROVIDER_SITE_OTHER): Payer: Medicare HMO | Admitting: Family Medicine

## 2018-02-24 ENCOUNTER — Encounter: Payer: Self-pay | Admitting: Family Medicine

## 2018-02-24 VITALS — BP 136/70 | HR 64 | Temp 97.5°F | Resp 14 | Ht 66.0 in | Wt 145.0 lb

## 2018-02-24 DIAGNOSIS — R21 Rash and other nonspecific skin eruption: Secondary | ICD-10-CM | POA: Diagnosis not present

## 2018-02-24 MED ORDER — TRIAMCINOLONE ACETONIDE 0.1 % EX OINT: TOPICAL_OINTMENT | CUTANEOUS | 0 refills | Status: DC

## 2018-02-24 NOTE — Patient Instructions (Addendum)
Rash A rash is a change in the color of the skin. A rash can also change the way your skin feels. There are many different conditions and factors that can cause a rash. Follow these instructions at home: Pay attention to any changes in your symptoms. Follow these instructions to help with your condition: Medicine Take or apply over-the-counter and prescription medicines only as told by your health care provider. These may include:  Corticosteroid cream.  Anti-itch lotions.  Oral antihistamines.  Skin Care  Apply cool compresses to the affected areas.  Try taking a bath with: ? Epsom salts. Follow the instructions on the packaging. You can get these at your local pharmacy or grocery store. ? Baking soda. Pour a small amount into the bath as told by your health care provider. ? Colloidal oatmeal. Follow the instructions on the packaging. You can get this at your local pharmacy or grocery store.  Try applying baking soda paste to your skin. Stir water into baking soda until it reaches a paste-like consistency.  Do not scratch or rub your skin.  Avoid covering the rash. Make sure the rash is exposed to air as much as possible. General instructions  Avoid hot showers or baths, which can make itching worse. A cold shower may help.  Avoid scented soaps, detergents, and perfumes. Use gentle soaps, detergents, perfumes, and other cosmetic products.  Avoid any substance that causes your rash. Keep a journal to help track what causes your rash. Write down: ? What you eat. ? What cosmetic products you use. ? What you drink. ? What you wear. This includes jewelry.  Keep all follow-up visits as told by your health care provider. This is important. Contact a health care provider if:  You sweat at night.  You lose weight.  You urinate more than normal.  You feel weak.  You vomit.  Your skin or the whites of your eyes look yellow (jaundice).  Your skin: ? Tingles. ? Is  numb.  Your rash: ? Does not go away after several days. ? Gets worse.  You are: ? Unusually thirsty. ? More tired than normal.  You have: ? New symptoms. ? Pain in your abdomen. ? A fever. ? Diarrhea. Get help right away if:  You develop a rash that covers all or most of your body. The rash may or may not be painful.  You develop blisters that: ? Are on top of the rash. ? Grow larger or grow together. ? Are painful. ? Are inside your nose or mouth.  You develop a rash that: ? Looks like purple pinprick-sized spots all over your body. ? Has a "bull's eye" or looks like a target. ? Is not related to sun exposure, is red and painful, and causes your skin to peel. This information is not intended to replace advice given to you by your health care provider. Make sure you discuss any questions you have with your health care provider. Document Released: 09/05/2002 Document Revised: 02/19/2016 Document Reviewed: 01/31/2015 Elsevier Interactive Patient Education  2018 [employer]  

## 2018-02-24 NOTE — Progress Notes (Signed)
  Subjective:     Amy Powell is a 72 y.o. female who presents for evaluation of a rash involving the neck. Rash started 7 day ago. Lesions are her skin color and some mildly erythematous, and raised in texture. Rash has gradually improved changed over time. Rash is pruritic. Associated symptoms: none. Patient denies: abdominal pain, arthralgia, congestion, cough, decrease in appetite, decrease in energy level, fever, headache, nausea, sore throat, vomiting and stridor, wheeze, lip swelling, intraoral swelling, SOB. Patient has not had contacts with similar rash. Patient has not had new exposures (soaps, lotions, laundry detergents, foods, medications, plants, insects or animals).  She has some improvement of itching with topical benadryl  The following portions of the patient's history were reviewed and updated as appropriate: allergies, current medications, past family history, past medical history, past social history, past surgical history and problem list.  Review of Systems Constitutional: negative for chills, fatigue, fevers, malaise, night sweats, sweats and weight loss Eyes: negative for irritation and redness Ears, nose, mouth, throat, and face: negative for hoarseness, nasal congestion, snoring, sore mouth, sore throat, tinnitus and voice change Respiratory: negative for asthma, chronic bronchitis, cough, dyspnea on exertion, pleurisy/chest pain, stridor and wheezing Cardiovascular: negative for chest pain, chest pressure/discomfort, dyspnea, irregular heart beat, near-syncope, palpitations, paroxysmal nocturnal dyspnea and tachypnea Gastrointestinal: negative for abdominal pain, diarrhea, nausea and vomiting Genitourinary:negative Integument/breast: negative Hematologic/lymphatic: negative Musculoskeletal:negative for arthralgias, muscle weakness, myalgias and stiff joints Neurological: negative for dizziness, headaches, vertigo and weakness Behavioral/Psych: negative Endocrine:  negative Allergic/Immunologic: negative for anaphylaxis and angioedema all other pertinent +/- associated sx in HPI    Objective:    BP 136/70   Pulse 64   Temp (!) 97.5 F (36.4 C) (Oral)   Resp 14   Ht 5\' 6"  (1.676 m)   Wt 145 lb (65.8 kg)   SpO2 98%   BMI 23.40 kg/m    Physical Exam  Nursing note and vitals reviewed. Constitutional: She appears well-developed and well-nourished. No distress.  HENT:  Head: Normocephalic and atraumatic.  Nose: Nose normal.  Mouth/Throat: Oropharynx is clear and moist. No oropharyngeal exudate.  Eyes: Pupils are equal, round, and reactive to light. Conjunctivae are normal. Right eye exhibits no discharge. Left eye exhibits no discharge. No scleral icterus.  Neck: Normal range of motion. Neck supple.  Cardiovascular: Normal rate, regular rhythm and intact distal pulses.  Respiratory: Effort normal and breath sounds normal. No stridor. No respiratory distress. She has no wheezes. She has no rales.  GI: Soft. Bowel sounds are normal.  Musculoskeletal: Normal range of motion.  Neurological: She is alert.  Skin: Skin is warm. Rash noted. She is not diaphoretic. No erythema.  Fine scattered 1 to 2 mm papular rash around neck, flesh-colored, no erythema  Psychiatric: She has a normal mood and affect. Her behavior is normal.    Physical Exam  Assessment:    1. Rash and nonspecific skin eruption Rash is extremely mild, suspect there may be a insect bite that caused that, or irritation from clothing, no concern for allergic reaction, or rash related to other systemic illness Will treat mildly pruritic nature of rash local steroid, use sparingly, strict patient to increase hydration of her skin.     Plan:    Medications: triamcinolone. Verbal patient instruction given. Follow up in as needed if not improving pt verbalized understanding.

## 2018-03-25 ENCOUNTER — Emergency Department (HOSPITAL_COMMUNITY): Payer: Medicare HMO

## 2018-03-25 ENCOUNTER — Emergency Department (HOSPITAL_COMMUNITY)
Admission: EM | Admit: 2018-03-25 | Discharge: 2018-03-26 | Disposition: A | Discharge status: Home / Self Care | Payer: Medicare HMO | Attending: Emergency Medicine | Admitting: Emergency Medicine

## 2018-03-25 ENCOUNTER — Encounter (HOSPITAL_COMMUNITY): Payer: Self-pay | Admitting: *Deleted

## 2018-03-25 ENCOUNTER — Other Ambulatory Visit: Payer: Self-pay

## 2018-03-25 DIAGNOSIS — Y93H9 Activity, other involving exterior property and land maintenance, building and construction: Secondary | ICD-10-CM | POA: Diagnosis not present

## 2018-03-25 DIAGNOSIS — S52612A Displaced fracture of left ulna styloid process, initial encounter for closed fracture: Secondary | ICD-10-CM | POA: Diagnosis not present

## 2018-03-25 DIAGNOSIS — S52502A Unspecified fracture of the lower end of left radius, initial encounter for closed fracture: Secondary | ICD-10-CM | POA: Diagnosis not present

## 2018-03-25 DIAGNOSIS — S5292XA Unspecified fracture of left forearm, initial encounter for closed fracture: Secondary | ICD-10-CM | POA: Insufficient documentation

## 2018-03-25 DIAGNOSIS — Y929 Unspecified place or not applicable: Secondary | ICD-10-CM | POA: Diagnosis not present

## 2018-03-25 DIAGNOSIS — Y999 Unspecified external cause status: Secondary | ICD-10-CM | POA: Diagnosis not present

## 2018-03-25 DIAGNOSIS — W132XXA Fall from, out of or through roof, initial encounter: Secondary | ICD-10-CM | POA: Diagnosis not present

## 2018-03-25 DIAGNOSIS — S6992XA Unspecified injury of left wrist, hand and finger(s), initial encounter: Secondary | ICD-10-CM | POA: Diagnosis present

## 2018-03-25 DIAGNOSIS — S52202A Unspecified fracture of shaft of left ulna, initial encounter for closed fracture: Secondary | ICD-10-CM | POA: Diagnosis not present

## 2018-03-25 DIAGNOSIS — S63006A Unspecified dislocation of unspecified wrist and hand, initial encounter: Secondary | ICD-10-CM | POA: Diagnosis not present

## 2018-03-25 DIAGNOSIS — S5291XA Unspecified fracture of right forearm, initial encounter for closed fracture: Secondary | ICD-10-CM | POA: Diagnosis not present

## 2018-03-25 MED ORDER — LIDOCAINE HCL (PF) 1 % IJ SOLN
10.0000 mL | Freq: Once | INTRAMUSCULAR | Status: AC
  Administered 2018-03-25: 10 mL via INTRADERMAL
  Filled 2018-03-25: qty 30

## 2018-03-25 MED ORDER — CEFAZOLIN SODIUM-DEXTROSE 1-4 GM/50ML-% IV SOLN
1.0000 g | Freq: Once | INTRAVENOUS | Status: AC
  Administered 2018-03-25: 1 g via INTRAVENOUS
  Filled 2018-03-25: qty 50

## 2018-03-25 MED ORDER — CEPHALEXIN 500 MG PO CAPS: 500.0000 mg | ORAL_CAPSULE | Freq: Four times a day (QID) | ORAL | 0 refills | Status: DC

## 2018-03-25 MED ORDER — HYDROMORPHONE HCL 1 MG/ML IJ SOLN
0.5000 mg | Freq: Once | INTRAMUSCULAR | Status: AC
  Administered 2018-03-25: 0.5 mg via INTRAVENOUS
  Filled 2018-03-25: qty 1

## 2018-03-25 MED ORDER — SODIUM CHLORIDE 0.9 % IV BOLUS
1000.0000 mL | Freq: Once | INTRAVENOUS | Status: AC
  Administered 2018-03-25: 1000 mL via INTRAVENOUS

## 2018-03-25 MED ORDER — HYDROCODONE-ACETAMINOPHEN 5-325 MG PO TABS: 2.0000 | ORAL_TABLET | Freq: Four times a day (QID) | ORAL | 0 refills | Status: DC | PRN

## 2018-03-25 NOTE — ED Provider Notes (Addendum)
Emergency Department Provider Note   I have reviewed the triage vital signs and the nursing notes.   HISTORY  Chief Complaint Fall   HPI Amy Powell is a 72 y.o. female who was working on a roof approximately 60 of the ground and fell landing on her left wrist suffering a instant pain and deformity to her left wrist called EMS who gave her fentanyl and brought her here for further evaluation.  At rest she is not in any pain.  No injuries elsewhere. No other associated or modifying symptoms.    History reviewed. No pertinent past medical history.  Patient Active Problem List   Diagnosis Date Noted  . BPPV (benign paroxysmal positional vertigo) 01/13/2013    Past Surgical History:  Procedure Laterality Date  . ABDOMINAL HYSTERECTOMY     for fibroids, [facility]    Current Outpatient Rx  . Order #: [id number] Class: Print  . Order #: [id number] Class: Print    Allergies Penicillins  Family History  Problem Relation Age of Onset  . Hypertension Mother   . Stroke Mother   . Diabetes Mother   . Alcohol abuse Father   . Hypertension Father   . Heart disease Brother   . Hypertension Sister   . Diabetes Sister   . Breast cancer Neg Hx     Social History Social History   Tobacco Use  . Smoking status: Never Smoker  . Smokeless tobacco: Never Used  Substance Use Topics  . Alcohol use: Yes  . Drug use: No    Review of Systems  All other systems negative except as documented in the HPI. All pertinent positives and negatives as reviewed in the HPI. ____________________________________________   PHYSICAL EXAM:  VITAL SIGNS: ED Triage Vitals [03/25/18 2033]  Enc Vitals Group     BP (!) 145/67     Pulse Rate 62     Resp 16     Temp 97.8 F (36.6 C)     Temp Source Oral     SpO2 94 %    Constitutional: Alert and oriented. Well appearing and in no acute distress. Eyes: Conjunctivae are normal. PERRL. EOMI. Head: Atraumatic. Nose: No  congestion/rhinnorhea. Mouth/Throat: Mucous membranes are moist.  Oropharynx non-erythematous. Neck: No stridor.  No meningeal signs.   Cardiovascular: Normal rate, regular rhythm. Good peripheral circulation. Grossly normal heart sounds.   Respiratory: Normal respiratory effort.  No retractions. Lungs CTAB. Gastrointestinal: Soft and nontender. No distention.  Musculoskeletal: left wrist deformity. Nvi.  Neurologic:  Normal speech and language. No gross focal neurologic deficits are appreciated.  Skin:  Skin is warm, dry and intact. No rash noted.  ____________________________________________  RADIOLOGY  Dg Wrist Complete Left  Result Date: 03/25/2018 CLINICAL DATA:  72 year old female status post reduction of the displaced and angulated distal radial and ulnar fracture. EXAM: LEFT WRIST - COMPLETE 3+ VIEW COMPARISON:  Earlier radiograph dated 03/25/2018 FINDINGS: Interval decrease in the displacement and angulation of the fractures of the distal radius and ulna with near anatomic alignment. No dislocation. There has been interval placement of a cast. There is diffuse soft tissue swelling of the distal forearm and wrist. IMPRESSION: Interval reduction and cast placement. Electronically Signed   By: Anner Crete M.D.   On: 03/25/2018 22:20   Dg Wrist Complete Left  Result Date: 03/25/2018 CLINICAL DATA:  Fall with left wrist pain EXAM: LEFT WRIST - COMPLETE 3+ VIEW COMPARISON:  None. FINDINGS: There are comminuted fractures of the distal left radius and  ulna. The fracture fragments of the radius are displaced and angulated in the dorsal direction. The carpus and hand are displaced dorsally and laterally relative to the shafts of the radius and ulna. IMPRESSION: Dorsally displaced and angulated, comminuted fractures of the distal left radius and ulna with dorsal and lateral dislocation of the left wrist. Electronically Signed   By: Ulyses Jarred M.D.   On: 03/25/2018 21:01     ____________________________________________   PROCEDURES  Procedure(s) performed:   .Nerve Block Date/Time: 03/25/2018 11:26 PM Performed by: Merrily Pew, MD Authorized by: Merrily Pew, MD   Consent:    Consent obtained:  Verbal   Consent given by:  Patient   Risks discussed:  Allergic reaction, infection and intravenous injection Indications:    Indications:  Pain relief and procedural anesthesia Location:    Body area:  Upper extremity   Upper extremity nerve blocked: hematoma block of left wrist.   Laterality:  Left Pre-procedure details:    Skin preparation:  Alcohol   Preparation: Patient was prepped and draped in usual sterile fashion   Skin anesthesia (see MAR for exact dosages):    Skin anesthesia method:  None Procedure details (see MAR for exact dosages):    Block needle gauge:  18 G   Guidance comment:  Anatomy   Anesthetic injected:  Lidocaine 1% w/o epi   Steroid injected:  None   Additive injected:  None   Injection procedure:  Anatomic landmarks identified, anatomic landmarks palpated and introduced needle   Paresthesia:  None Post-procedure details:    Dressing:  None   Outcome:  Pain relieved   Patient tolerance of procedure:  Tolerated well, no immediate complications  .Ortho Injury Treatment Date/Time: 03/25/2018 11:34 PM Performed by: Merrily Pew, MD Authorized by: Merrily Pew, MD   Consent:    Consent obtained:  Verbal   Consent given by:  Patient   Risks discussed:  Irreducible dislocation and recurrent dislocation   Alternatives discussed:  Delayed treatment and no treatmentInjury location: wrist Location details: left wrist Injury type: fracture-dislocation Pre-procedure neurovascular assessment: neurovascularly intact Pre-procedure distal perfusion: normal Pre-procedure neurological function: normal Pre-procedure range of motion: reduced Anesthesia: hematoma block  Anesthesia: Local anesthesia used: yes Local  Anesthetic: lidocaine 1% without epinephrine Anesthetic total: 10 mL  Patient sedated: NoManipulation performed: yes Skin traction used: no Skeletal traction used: yes Reduction successful: yes X-ray confirmed reduction: yes Immobilization: splint Supplies used: cotton padding and Ortho-Glass Post-procedure neurovascular assessment: post-procedure neurovascularly intact Post-procedure distal perfusion: normal Post-procedure neurological function: normal Post-procedure range of motion: normal Patient tolerance: Patient tolerated the procedure well with no immediate complications    ____________________________________________   INITIAL IMPRESSION / ASSESSMENT AND PLAN / ED COURSE  Patient with a both bone fracture of her left arm.  Reduced as above.  Postreduction x-rays with very significant improvement and alignment.  We will have her follow-up with hand surgery.  She has some abrasions over her hand and since will be put in a splint I will start antibiotics.    Discussed with Dr. Fredna Dow and will see after she calls for a follow up appointment. Will ct for operative planning.  Pertinent labs & imaging results that were available during my care of the patient were reviewed by me and considered in my medical decision making (see chart for details).  ____________________________________________  FINAL CLINICAL IMPRESSION(S) / ED DIAGNOSES  Final diagnoses:  Closed fracture of left radius and ulna, initial encounter     MEDICATIONS GIVEN DURING  THIS VISIT:  Medications  ceFAZolin (ANCEF) IVPB 1 g/50 mL premix (1 g Intravenous New Bag/Given 03/25/18 2329)  HYDROmorphone (DILAUDID) injection 0.5 mg (0.5 mg Intravenous Given 03/25/18 2058)  lidocaine (PF) (XYLOCAINE) 1 % injection 10 mL (10 mLs Intradermal Given 03/25/18 2328)  sodium chloride 0.9 % bolus 1,000 mL (0 mLs Intravenous Stopped 03/25/18 2202)     NEW OUTPATIENT MEDICATIONS STARTED DURING THIS VISIT:  New  Prescriptions   CEPHALEXIN (KEFLEX) 500 MG CAPSULE    Take 1 capsule (500 mg total) by mouth 4 (four) times daily.   HYDROCODONE-ACETAMINOPHEN (NORCO/VICODIN) 5-325 MG TABLET    Take 2 tablets by mouth every 6 (six) hours as needed for severe pain.    Note:  This note was prepared with assistance of Dragon voice recognition software. Occasional wrong-word or sound-a-like substitutions may have occurred due to the inherent limitations of voice recognition software.   Adalaide Jaskolski, Corene Cornea, MD 03/25/18 2119    Merrily Pew, MD 03/25/18 [phone]

## 2018-03-25 NOTE — ED Notes (Signed)
Bed: DL83 Expected date:  Expected time:  Means of arrival:  Comments: [place]

## 2018-03-25 NOTE — ED Notes (Signed)
Lidocaine at bedside for provider  

## 2018-03-25 NOTE — ED Triage Notes (Signed)
Pt reports she was cleaning her roof, on a 6 foot ladder, fell off and tried to catch her fall with her L hand.  Obvious deformity to L wrist noted.  CMS intact.

## 2018-03-26 ENCOUNTER — Emergency Department (HOSPITAL_COMMUNITY): Payer: Medicare HMO

## 2018-03-26 DIAGNOSIS — S52502A Unspecified fracture of the lower end of left radius, initial encounter for closed fracture: Secondary | ICD-10-CM | POA: Diagnosis not present

## 2018-03-26 DIAGNOSIS — S52202A Unspecified fracture of shaft of left ulna, initial encounter for closed fracture: Secondary | ICD-10-CM | POA: Diagnosis not present

## 2018-03-26 DIAGNOSIS — W132XXA Fall from, out of or through roof, initial encounter: Secondary | ICD-10-CM | POA: Diagnosis not present

## 2018-03-26 DIAGNOSIS — S5292XA Unspecified fracture of left forearm, initial encounter for closed fracture: Secondary | ICD-10-CM | POA: Diagnosis not present

## 2018-04-05 DIAGNOSIS — S52692A Other fracture of lower end of left ulna, initial encounter for closed fracture: Secondary | ICD-10-CM | POA: Diagnosis not present

## 2018-04-05 DIAGNOSIS — S52572A Other intraarticular fracture of lower end of left radius, initial encounter for closed fracture: Secondary | ICD-10-CM | POA: Diagnosis not present

## 2018-04-05 DIAGNOSIS — M25532 Pain in left wrist: Secondary | ICD-10-CM | POA: Diagnosis not present

## 2018-04-05 DIAGNOSIS — M25522 Pain in left elbow: Secondary | ICD-10-CM | POA: Diagnosis not present

## 2018-04-05 DIAGNOSIS — M25632 Stiffness of left wrist, not elsewhere classified: Secondary | ICD-10-CM | POA: Diagnosis not present

## 2018-04-12 DIAGNOSIS — S52572A Other intraarticular fracture of lower end of left radius, initial encounter for closed fracture: Secondary | ICD-10-CM | POA: Diagnosis not present

## 2018-04-12 DIAGNOSIS — S52572D Other intraarticular fracture of lower end of left radius, subsequent encounter for closed fracture with routine healing: Secondary | ICD-10-CM | POA: Diagnosis not present

## 2018-05-03 DIAGNOSIS — S52572D Other intraarticular fracture of lower end of left radius, subsequent encounter for closed fracture with routine healing: Secondary | ICD-10-CM | POA: Diagnosis not present

## 2018-05-03 DIAGNOSIS — S52602D Unspecified fracture of lower end of left ulna, subsequent encounter for closed fracture with routine healing: Secondary | ICD-10-CM | POA: Insufficient documentation

## 2018-05-03 DIAGNOSIS — S52692D Other fracture of lower end of left ulna, subsequent encounter for closed fracture with routine healing: Secondary | ICD-10-CM | POA: Diagnosis not present

## 2018-06-07 DIAGNOSIS — S52572D Other intraarticular fracture of lower end of left radius, subsequent encounter for closed fracture with routine healing: Secondary | ICD-10-CM | POA: Diagnosis not present

## 2018-06-07 DIAGNOSIS — S52692D Other fracture of lower end of left ulna, subsequent encounter for closed fracture with routine healing: Secondary | ICD-10-CM | POA: Diagnosis not present

## 2018-08-07 DIAGNOSIS — Z13820 Encounter for screening for osteoporosis: Secondary | ICD-10-CM | POA: Diagnosis not present

## 2018-08-16 ENCOUNTER — Telehealth: Payer: Self-pay | Admitting: Family Medicine

## 2018-08-16 NOTE — Telephone Encounter (Signed)
Patient called in states that her insurance company sent her a letter requesting she have a colorguard test done.  Patient would like [place] to send in her information so they will send her the kit.  CB# [phone]

## 2018-09-01 NOTE — Telephone Encounter (Signed)
Order faxed to [employer]

## 2018-09-06 DIAGNOSIS — Z1211 Encounter for screening for malignant neoplasm of colon: Secondary | ICD-10-CM | POA: Diagnosis not present

## 2018-09-06 DIAGNOSIS — Z1212 Encounter for screening for malignant neoplasm of rectum: Secondary | ICD-10-CM | POA: Diagnosis not present

## 2018-09-06 LAB — COLOGUARD

## 2018-09-23 ENCOUNTER — Ambulatory Visit (INDEPENDENT_AMBULATORY_CARE_PROVIDER_SITE_OTHER): Payer: Medicare HMO | Admitting: Family Medicine

## 2018-09-23 ENCOUNTER — Other Ambulatory Visit: Payer: Self-pay

## 2018-09-23 ENCOUNTER — Encounter: Payer: Self-pay | Admitting: Family Medicine

## 2018-09-23 VITALS — BP 136/78 | HR 88 | Temp 97.9°F | Resp 12 | Ht 66.0 in | Wt 144.0 lb

## 2018-09-23 DIAGNOSIS — E785 Hyperlipidemia, unspecified: Secondary | ICD-10-CM | POA: Diagnosis not present

## 2018-09-23 DIAGNOSIS — Z0000 Encounter for general adult medical examination without abnormal findings: Secondary | ICD-10-CM

## 2018-09-23 DIAGNOSIS — Z780 Asymptomatic menopausal state: Secondary | ICD-10-CM | POA: Diagnosis not present

## 2018-09-23 DIAGNOSIS — Z13220 Encounter for screening for lipoid disorders: Secondary | ICD-10-CM

## 2018-09-23 LAB — LIPID PANEL
Cholesterol: 212 mg/dL — ABNORMAL HIGH (ref <200)
HDL: 56 mg/dL (ref >50)
LDL Cholesterol (Calc): 133 mg/dL (calc) — ABNORMAL HIGH
Non-HDL Cholesterol (Calc): 156 mg/dL (calc) — ABNORMAL HIGH (ref <130)
TRIGLYCERIDES: 120 mg/dL (ref <150)
Total CHOL/HDL Ratio: 3.8 (calc) (ref <5.0)

## 2018-09-23 LAB — COMPLETE METABOLIC PANEL WITH GFR
AG RATIO: 1.5 (calc) (ref 1.0–2.5)
ALT: 8 U/L (ref 6–29)
AST: 14 U/L (ref 10–35)
Albumin: 4.1 g/dL (ref 3.6–5.1)
Alkaline phosphatase (APISO): 79 U/L (ref 33–130)
BUN/Creatinine Ratio: 19 (calc) (ref 6–22)
BUN: 21 mg/dL (ref 7–25)
CHLORIDE: 106 mmol/L (ref 98–110)
CO2: 25 mmol/L (ref 20–32)
Calcium: 9.2 mg/dL (ref 8.6–10.4)
Creat: 1.13 mg/dL — ABNORMAL HIGH (ref 0.60–0.93)
GFR, EST AFRICAN AMERICAN: 56 mL/min/{1.73_m2} — AB (ref >=60)
GFR, Est Non African American: 49 mL/min/{1.73_m2} — ABNORMAL LOW (ref >=60)
Globulin: 2.7 g/dL (calc) (ref 1.9–3.7)
Glucose, Bld: 86 mg/dL (ref 65–99)
POTASSIUM: 4.8 mmol/L (ref 3.5–5.3)
Sodium: 140 mmol/L (ref 135–146)
TOTAL PROTEIN: 6.8 g/dL (ref 6.1–8.1)
Total Bilirubin: 0.7 mg/dL (ref 0.2–1.2)

## 2018-09-23 LAB — CBC WITH DIFFERENTIAL/PLATELET
Absolute Monocytes: 641 cells/uL (ref 200–950)
BASOS ABS: 43 {cells}/uL (ref 0–200)
Basophils Relative: 0.6 %
EOS ABS: 79 {cells}/uL (ref 15–500)
Eosinophils Relative: 1.1 %
HCT: 40.1 % (ref 35.0–45.0)
Hemoglobin: 13.1 g/dL (ref 11.7–15.5)
Lymphs Abs: 2110 cells/uL (ref 850–3900)
MCH: 26 pg — AB (ref 27.0–33.0)
MCHC: 32.7 g/dL (ref 32.0–36.0)
MCV: 79.6 fL — AB (ref 80.0–100.0)
MPV: 9.8 fL (ref 7.5–12.5)
Monocytes Relative: 8.9 %
NEUTROS PCT: 60.1 %
Neutro Abs: 4327 cells/uL (ref 1500–7800)
PLATELETS: 188 10*3/uL (ref 140–400)
RBC: 5.04 10*6/uL (ref 3.80–5.10)
RDW: 14 % (ref 11.0–15.0)
TOTAL LYMPHOCYTE: 29.3 %
WBC: 7.2 10*3/uL (ref 3.8–10.8)

## 2018-09-23 NOTE — Progress Notes (Signed)
Subjective:    Patient ID: Amy Powell, female    DOB: 1946/08/11, 72 y.o.   MRN: [id number]  HPI  Patient is here today for a complete physical exam.  Her review of systems is negative.  She denies any medical problems.  She denies any problems with falls.  She denies any problems with depression.  She denies any issues with memory loss.  She denies any difficulties performing her activities of daily living.  She is exercising and walking on a daily basis with no difficulty.  Her last mammogram was in January 2019 and is due again in 1 month.  She has never had a colonoscopy and refuses this.  She did recently complete Cologuard although we have not received the results yet.  She is due for a bone density test.  Has a past medical history of a hysterectomy and therefore does not require a Pap smear or pelvic exam as she had a TAH/BSO.    History reviewed. No pertinent past medical history. Past Surgical History:  Procedure Laterality Date  . ABDOMINAL HYSTERECTOMY     for fibroids, TAHBSO    No current outpatient medications on file prior to visit.   No current facility-administered medications on file prior to visit.    Allergies  Allergen Reactions  . Penicillins Itching    Has patient had a PCN reaction causing immediate rash, facial/tongue/throat swelling, SOB or lightheadedness with hypotension: Y Has patient had a PCN reaction causing severe rash involving mucus membranes or skin necrosis: Y Has patient had a PCN reaction that required hospitalization: N Has patient had a PCN reaction occurring within the last 10 years: N If all of the above answers are "NO", then may proceed with Cephalosporin use.    Social History   Socioeconomic History  . Marital status: Married    Spouse name: Not on file  . Number of children: Not on file  . Years of education: Not on file  . Highest education level: Not on file  Occupational History  . Not on file  Social Needs  . Financial  resource strain: Not on file  . Food insecurity:    Worry: Not on file    Inability: Not on file  . Transportation needs:    Medical: Not on file    Non-medical: Not on file  Tobacco Use  . Smoking status: Never Smoker  . Smokeless tobacco: Never Used  Substance and Sexual Activity  . Alcohol use: Yes  . Drug use: No  . Sexual activity: Not on file    Comment: Works at [employer].  Lifestyle  . Physical activity:    Days per week: Not on file    Minutes per session: Not on file  . Stress: Not on file  Relationships  . Social connections:    Talks on phone: Not on file    Gets together: Not on file    Attends religious service: Not on file    Active member of club or organization: Not on file    Attends meetings of clubs or organizations: Not on file    Relationship status: Not on file  . Intimate partner violence:    Fear of current or ex partner: Not on file    Emotionally abused: Not on file    Physically abused: Not on file    Forced sexual activity: Not on file  Other Topics Concern  . Not on file  Social History Narrative  . Not on  file    Family History  Problem Relation Age of Onset  . Hypertension Mother   . Stroke Mother   . Diabetes Mother   . Alcohol abuse Father   . Hypertension Father   . Heart disease Brother   . Hypertension Sister   . Diabetes Sister   . Breast cancer Neg Hx     Review of Systems  All other systems reviewed and are negative.      Objective:   Physical Exam  Constitutional: She is oriented to person, place, and time. She appears well-developed and well-nourished. No distress.  HENT:  Head: Normocephalic and atraumatic.  Right Ear: External ear normal.  Left Ear: External ear normal.  Nose: Nose normal.  Mouth/Throat: Oropharynx is clear and moist. No oropharyngeal exudate.  Eyes: Pupils are equal, round, and reactive to light. Conjunctivae and EOM are normal. Right eye exhibits no discharge. Left eye exhibits no  discharge. No scleral icterus.  Neck: Normal range of motion. Neck supple. No JVD present. No tracheal deviation present. No thyromegaly present.  Cardiovascular: Normal rate, regular rhythm, normal heart sounds and intact distal pulses. Exam reveals no gallop and no friction rub.  No murmur heard. Pulmonary/Chest: Effort normal and breath sounds normal. No stridor. No respiratory distress. She has no wheezes. She has no rales. She exhibits no tenderness.  Abdominal: Soft. Bowel sounds are normal. She exhibits no distension and no mass. There is no abdominal tenderness. There is no rebound and no guarding.  Musculoskeletal: Normal range of motion.        General: No tenderness or edema.  Lymphadenopathy:    She has no cervical adenopathy.  Neurological: She is alert and oriented to person, place, and time. She has normal reflexes. No cranial nerve deficit. She exhibits normal muscle tone. Coordination normal.  Skin: Skin is warm. No rash noted. She is not diaphoretic. No erythema. No pallor.  Psychiatric: She has a normal mood and affect. Her behavior is normal. Judgment and thought content normal.  Vitals reviewed.       Assessment & Plan:  Screening cholesterol level - Plan: CBC with Differential/Platelet, COMPLETE METABOLIC PANEL WITH GFR, Lipid panel  General medical exam  Postmenopausal estrogen deficiency - Plan: DG Bone Density Recommended a mammogram in January.  I will schedule the patient for a bone density test.  Patient has recently completed the Cologuard test.  I will await the results of this.  Acquire Pap smear.  She denies issues with falls, depression, or memory loss.  She adamantly refuses a flu shot, shingles vaccine, Pneumovax 23, and Prevnar 13.  Strongly encouraged her to reconsider but she declined.  Regular anticipatory guidance is provided

## 2018-09-24 ENCOUNTER — Other Ambulatory Visit: Payer: Self-pay | Admitting: Family Medicine

## 2018-09-24 ENCOUNTER — Encounter: Payer: Self-pay | Admitting: Family Medicine

## 2018-09-24 DIAGNOSIS — N183 Chronic kidney disease, stage 3 unspecified: Secondary | ICD-10-CM | POA: Insufficient documentation

## 2018-09-24 DIAGNOSIS — Z1231 Encounter for screening mammogram for malignant neoplasm of breast: Secondary | ICD-10-CM

## 2018-09-24 DIAGNOSIS — E2839 Other primary ovarian failure: Secondary | ICD-10-CM

## 2018-09-24 DIAGNOSIS — E785 Hyperlipidemia, unspecified: Secondary | ICD-10-CM | POA: Insufficient documentation

## 2018-09-30 ENCOUNTER — Other Ambulatory Visit: Payer: Self-pay | Admitting: *Deleted

## 2018-09-30 MED ORDER — ATORVASTATIN CALCIUM 20 MG PO TABS: 20.0000 mg | ORAL_TABLET | Freq: Every day | ORAL | 3 refills | Status: DC

## 2018-09-30 NOTE — Telephone Encounter (Signed)
Received the results of Cologuard screening.   Screening is negative.   A negative result indicates a low likelihood of colorectal cancer is present. Following a negative Cologuard result, the [employer] Society recommends a Cologuard re-screening interval of 3 years.   Letter sent.  

## 2018-10-15 ENCOUNTER — Telehealth: Payer: Self-pay | Admitting: Family Medicine

## 2018-10-15 NOTE — Telephone Encounter (Signed)
ok 

## 2018-10-15 NOTE — Telephone Encounter (Signed)
I have cancelled order.

## 2018-10-15 NOTE — Telephone Encounter (Signed)
I called patient to ask about mammogram I was trying to get it set up.  Patient states she called  her insurance company and they advised she only needed a mammogram ever 2 years. She just had one last year so she isn't due until 2021.  I am going to cancel the order for mammogram at this time. If that is fine with you.

## 2018-11-02 DIAGNOSIS — H5211 Myopia, right eye: Secondary | ICD-10-CM | POA: Diagnosis not present

## 2018-11-02 DIAGNOSIS — H25013 Cortical age-related cataract, bilateral: Secondary | ICD-10-CM | POA: Diagnosis not present

## 2018-11-02 DIAGNOSIS — H2513 Age-related nuclear cataract, bilateral: Secondary | ICD-10-CM | POA: Diagnosis not present

## 2018-11-02 DIAGNOSIS — H52201 Unspecified astigmatism, right eye: Secondary | ICD-10-CM | POA: Diagnosis not present

## 2018-11-02 DIAGNOSIS — H524 Presbyopia: Secondary | ICD-10-CM | POA: Diagnosis not present

## 2018-11-10 ENCOUNTER — Ambulatory Visit: Payer: Self-pay

## 2018-11-10 ENCOUNTER — Other Ambulatory Visit: Payer: Self-pay

## 2018-11-10 DIAGNOSIS — H25812 Combined forms of age-related cataract, left eye: Secondary | ICD-10-CM | POA: Diagnosis not present

## 2018-11-10 DIAGNOSIS — H2511 Age-related nuclear cataract, right eye: Secondary | ICD-10-CM | POA: Diagnosis not present

## 2018-11-10 DIAGNOSIS — H25011 Cortical age-related cataract, right eye: Secondary | ICD-10-CM | POA: Diagnosis not present

## 2018-11-17 DIAGNOSIS — H25012 Cortical age-related cataract, left eye: Secondary | ICD-10-CM | POA: Diagnosis not present

## 2018-11-17 DIAGNOSIS — H2512 Age-related nuclear cataract, left eye: Secondary | ICD-10-CM | POA: Diagnosis not present

## 2018-12-17 ENCOUNTER — Inpatient Hospital Stay: Admission: RE | Admit: 2018-12-17 | Payer: Self-pay | Source: Ambulatory Visit

## 2019-02-04 DIAGNOSIS — Z0100 Encounter for examination of eyes and vision without abnormal findings: Secondary | ICD-10-CM | POA: Diagnosis not present

## 2019-02-18 ENCOUNTER — Other Ambulatory Visit: Payer: Self-pay

## 2019-04-14 ENCOUNTER — Other Ambulatory Visit: Payer: Self-pay

## 2019-04-14 ENCOUNTER — Ambulatory Visit
Admission: RE | Admit: 2019-04-14 | Discharge: 2019-04-14 | Disposition: A | Discharge status: Home / Self Care | Payer: Medicare HMO | Source: Ambulatory Visit | Attending: Family Medicine | Admitting: Family Medicine

## 2019-04-14 DIAGNOSIS — M810 Age-related osteoporosis without current pathological fracture: Secondary | ICD-10-CM | POA: Diagnosis not present

## 2019-04-14 DIAGNOSIS — M85852 Other specified disorders of bone density and structure, left thigh: Secondary | ICD-10-CM | POA: Diagnosis not present

## 2019-04-14 DIAGNOSIS — E2839 Other primary ovarian failure: Secondary | ICD-10-CM

## 2019-04-14 DIAGNOSIS — Z780 Asymptomatic menopausal state: Secondary | ICD-10-CM | POA: Diagnosis not present

## 2019-04-15 ENCOUNTER — Encounter: Payer: Self-pay | Admitting: Family Medicine

## 2019-04-15 DIAGNOSIS — M810 Age-related osteoporosis without current pathological fracture: Secondary | ICD-10-CM | POA: Insufficient documentation

## 2019-04-20 ENCOUNTER — Other Ambulatory Visit: Payer: Self-pay | Admitting: Family Medicine

## 2019-04-20 MED ORDER — ALENDRONATE SODIUM 70 MG PO TABS: 70.0000 mg | ORAL_TABLET | ORAL | 3 refills | Status: DC

## 2019-05-30 DIAGNOSIS — M2011 Hallux valgus (acquired), right foot: Secondary | ICD-10-CM | POA: Diagnosis not present

## 2019-05-30 DIAGNOSIS — M79674 Pain in right toe(s): Secondary | ICD-10-CM | POA: Diagnosis not present

## 2019-05-30 DIAGNOSIS — L851 Acquired keratosis [keratoderma] palmaris et plantaris: Secondary | ICD-10-CM | POA: Diagnosis not present

## 2019-06-11 DIAGNOSIS — E7800 Pure hypercholesterolemia, unspecified: Secondary | ICD-10-CM | POA: Diagnosis not present

## 2019-06-11 DIAGNOSIS — C61 Malignant neoplasm of prostate: Secondary | ICD-10-CM | POA: Diagnosis not present

## 2019-06-11 DIAGNOSIS — Z20828 Contact with and (suspected) exposure to other viral communicable diseases: Secondary | ICD-10-CM | POA: Diagnosis not present

## 2019-08-17 DIAGNOSIS — Z7982 Long term (current) use of aspirin: Secondary | ICD-10-CM | POA: Diagnosis not present

## 2019-08-17 DIAGNOSIS — Z880 Allergy status to penicillin: Secondary | ICD-10-CM | POA: Diagnosis not present

## 2019-10-04 ENCOUNTER — Other Ambulatory Visit: Payer: Self-pay

## 2019-10-04 ENCOUNTER — Ambulatory Visit: Payer: Medicare HMO | Attending: Internal Medicine

## 2019-10-04 DIAGNOSIS — Z20822 Contact with and (suspected) exposure to covid-19: Secondary | ICD-10-CM

## 2019-10-06 LAB — NOVEL CORONAVIRUS, NAA: SARS-CoV-2, NAA: DETECTED — AB

## 2019-10-18 IMAGING — CT CT WRIST*L* W/O CM
3 of 4 series · 11 of 34 positions shown, 13 images · non-contrast
Comparison: Pre reduction radiographs 03/25/2018

CLINICAL DATA: Distal radius and ulnar fractures. Pre-surgical
planning.

EXAM:
CT OF THE LEFT WRIST WITHOUT CONTRAST
TECHNIQUE: Multidetector CT imaging was performed according to the standard
protocol. Multiplanar CT image reconstructions were also generated.

[Series 3: thin st · axial · 0.21mm/px · z∈[+1303,+1375]mm · 3 of 361 slices shown, 4 images]
[im 61/361  soft-tissue]
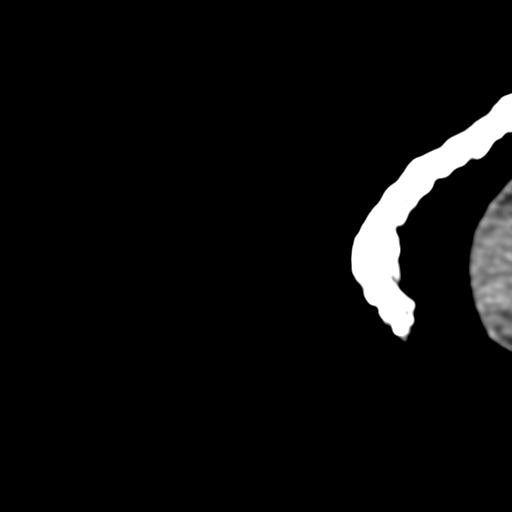
[im 61/361  bone]
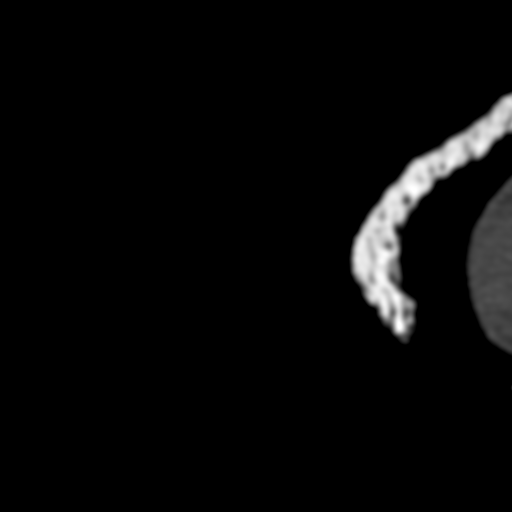
[im 181/361  bone]
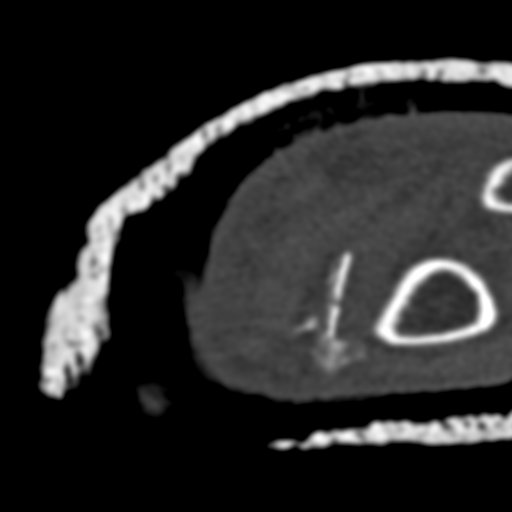
[im 301/361  bone]
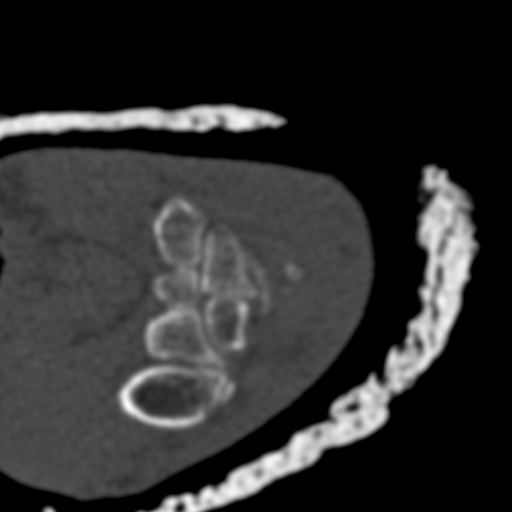

[Series 9: cor st · coronal · 0.15mm/px · 3 of 51 slices shown]
[im 11/51  bone]
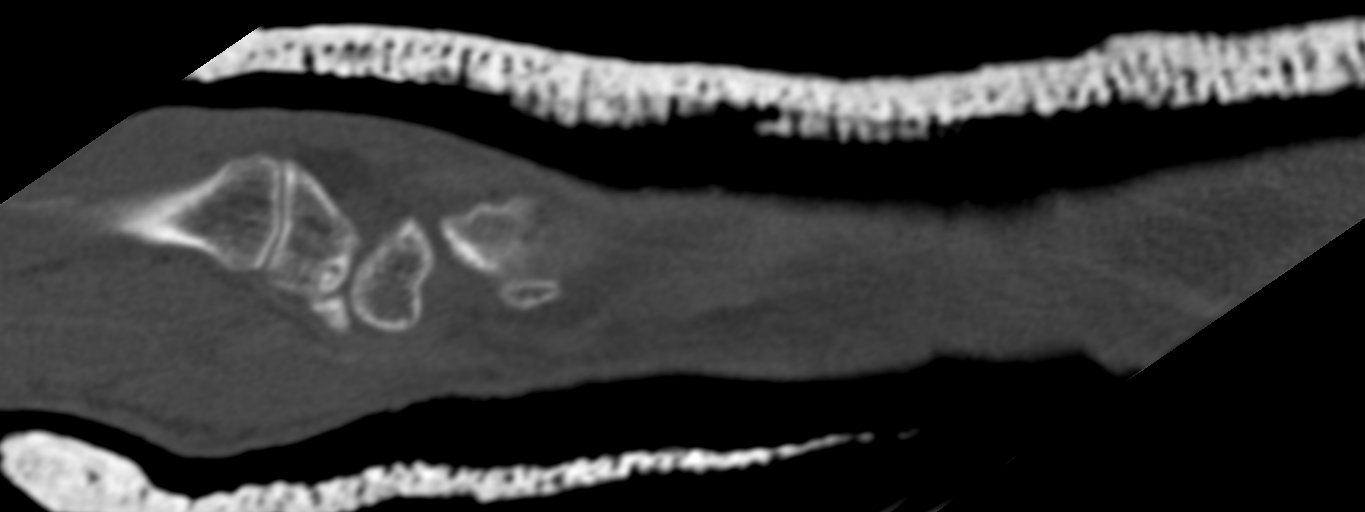
[im 21/51  bone]
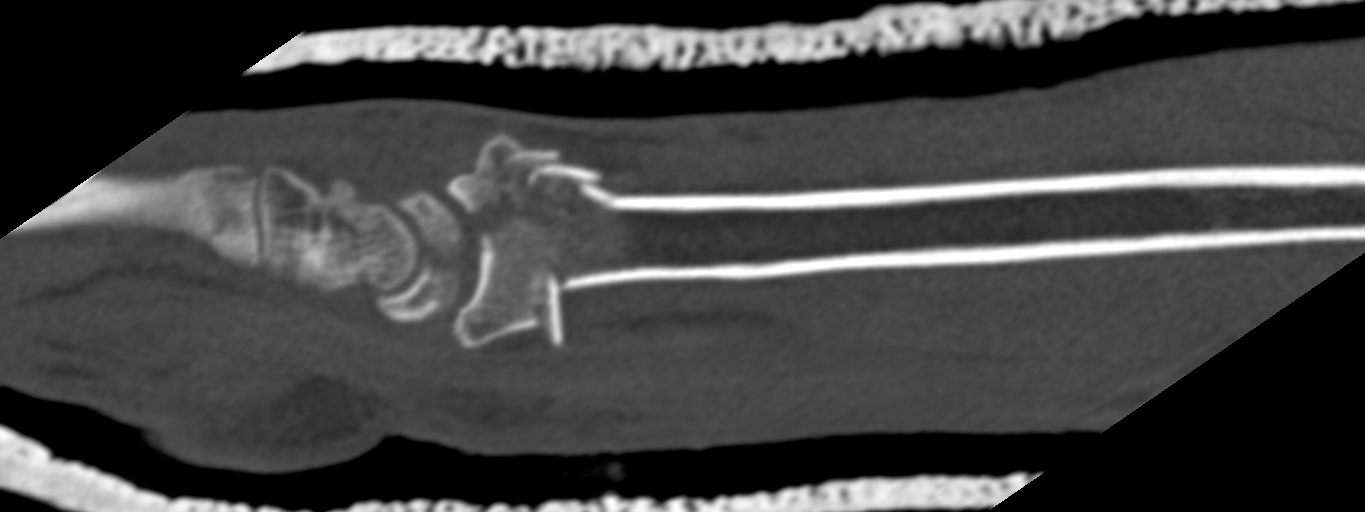
[im 31/51  bone]
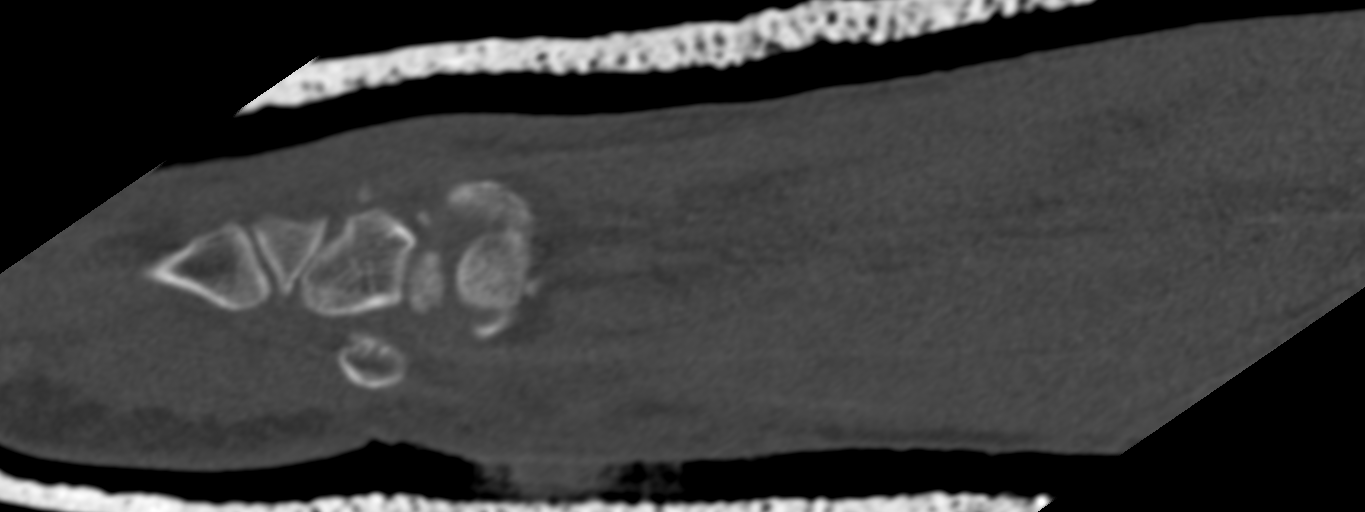

[Series 10: sag st · sagittal · 0.12mm/px · 5 of 134 slices shown, 6 images]
[im 54/134  bone]
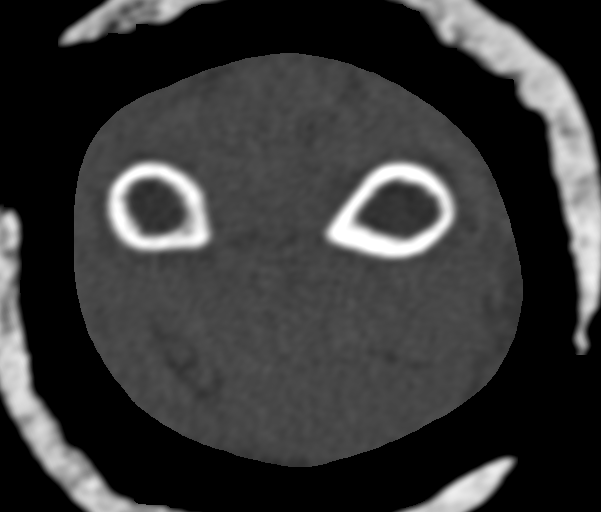
[im 60/134  bone]
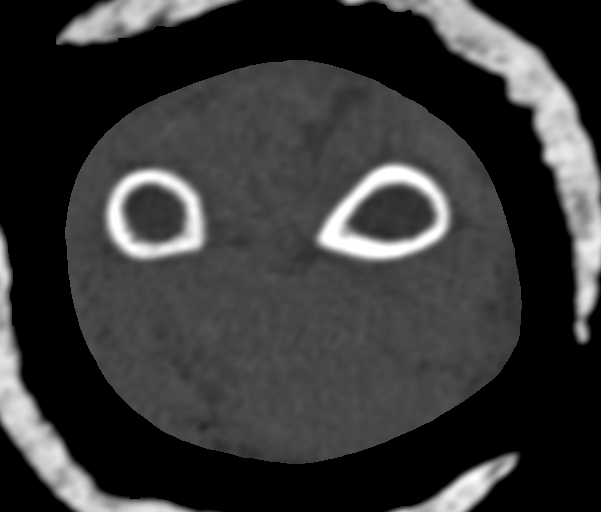
[im 67/134  soft-tissue]
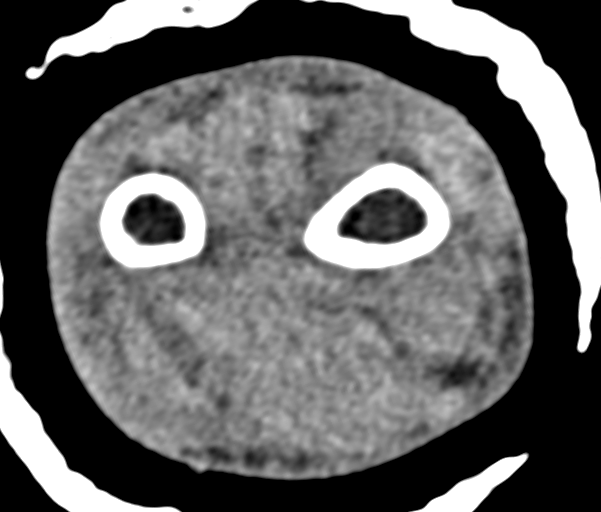
[im 67/134  bone]
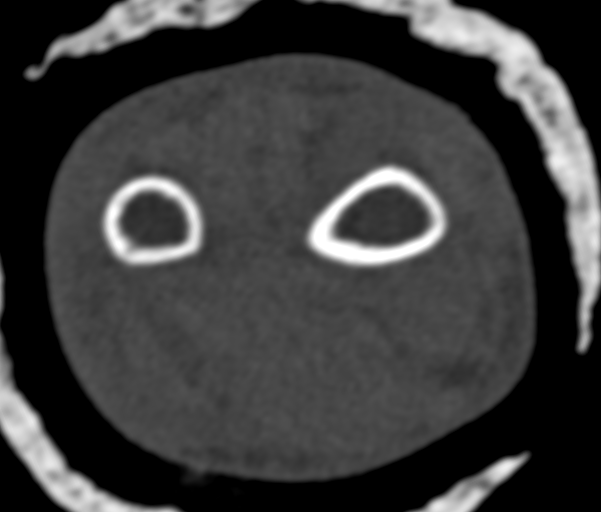
[im 74/134  bone]
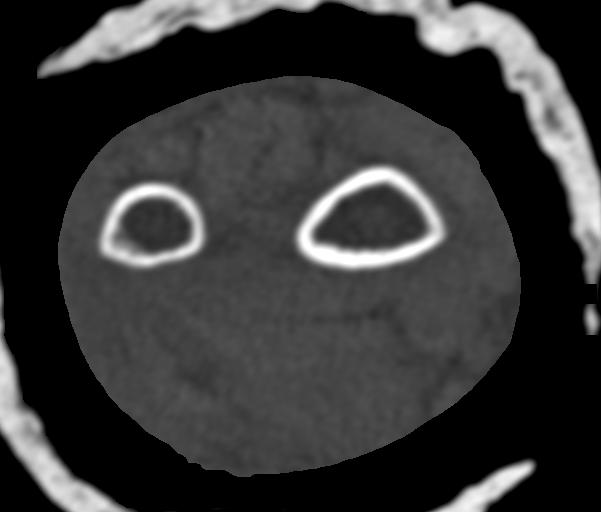
[im 80/134  bone]
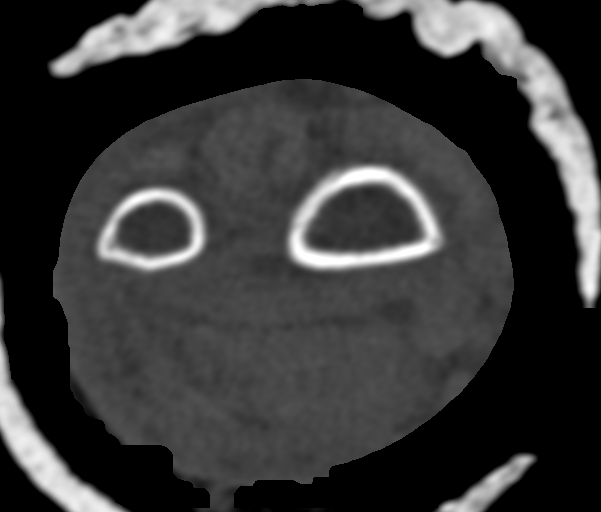

[11 of 34 positions shown; findings below may reference images not displayed]

FINDINGS: Bones/Joint/Cartilage

Metacarpal bases: Intact.

Carpal rows: Intact

Carpal bones: Subcortical degenerative cystic change noted of all
the carpal bones more severely affecting the lunate, capitate and
hamate with lesser degrees of sub cortical cystic change of the
trapezoid, distal pole of scaphoid and triquetrum.

Intrinsic ligaments: Suboptimally assessed on CT but no widening of
the scapholunate nor lunatotriquetral intervals.

Radius and ulna: Comminuted intra-articular fracture of the distal
radius extending into the distal radioulnar joint as well as
radiocarpal joint at the level of the scaphoid. Fracture of the
radius also is seen about Lister's tubercle the axial view. An 8 mm
perpendicularly oriented fracture fragment along the volar aspect of
the radial metaphysis is noted relative to the long axis of the
radius. Comminuted fracture of the ulna is also present undermining
styloid of the radius. A small displaced ossific fracture fragment
is noted measuring 5 mm along the ulnar aspect of the ulnar
metaphysis.

Ligaments

Suboptimally assessed by CT.

Muscles and Tendons

Negative

Soft tissues

Soft tissue swelling about the fracture.
IMPRESSION: 1. Comminuted distal radius and ulnar fractures as above with
intra-articular extension of radius fracture into the radiocarpal
and distal radioulnar joints. Improved alignment status post
reduction.
2. Comminuted distal ulnar fracture undermining the styloid of the
radius with small displaced fracture fragment as above.
3. Osteoarthritis of the carpal bones.

## 2019-10-24 ENCOUNTER — Ambulatory Visit (INDEPENDENT_AMBULATORY_CARE_PROVIDER_SITE_OTHER): Payer: Medicare HMO | Admitting: Family Medicine

## 2019-10-24 ENCOUNTER — Encounter: Payer: Self-pay | Admitting: Family Medicine

## 2019-10-24 ENCOUNTER — Other Ambulatory Visit: Payer: Self-pay

## 2019-10-24 VITALS — BP 150/72 | HR 68 | Temp 97.2°F | Resp 16 | Ht 66.0 in | Wt 141.0 lb

## 2019-10-24 DIAGNOSIS — M810 Age-related osteoporosis without current pathological fracture: Secondary | ICD-10-CM | POA: Diagnosis not present

## 2019-10-24 DIAGNOSIS — Z13220 Encounter for screening for lipoid disorders: Secondary | ICD-10-CM

## 2019-10-24 DIAGNOSIS — Z23 Encounter for immunization: Secondary | ICD-10-CM

## 2019-10-24 DIAGNOSIS — Z13228 Encounter for screening for other metabolic disorders: Secondary | ICD-10-CM | POA: Diagnosis not present

## 2019-10-24 MED ORDER — ALENDRONATE SODIUM 70 MG PO TABS: 70.0000 mg | ORAL_TABLET | ORAL | 3 refills | Status: DC

## 2019-10-24 NOTE — Progress Notes (Addendum)
Established Patient Office Visit  Subjective:  Patient ID: Amy Powell, female    DOB: 06/06/1946  Age: 74 y.o. MRN: [id number]  CC:  Chief Complaint  Patient presents with  . Medication Management    Pt NOT fasting  . Medication Refill    HPI Amy Powell 74 y.o A.A. female presenting for follow up. She has not visited office in a while without lab review. Discussed mammogram, pap/vaginal exam, tetanus  Past Medical History:  Diagnosis Date  . CKD (chronic kidney disease) stage 3, GFR 30-59 ml/min   . HLD (hyperlipidemia)   . Osteoporosis     Past Surgical History:  Procedure Laterality Date  . ABDOMINAL HYSTERECTOMY     for fibroids, TAHBSO    Family History  Problem Relation Age of Onset  . Hypertension Mother   . Stroke Mother   . Diabetes Mother   . Alcohol abuse Father   . Hypertension Father   . Heart disease Brother   . Hypertension Sister   . Diabetes Sister   . Breast cancer Neg Hx     Social History   Socioeconomic History  . Marital status: Married    Spouse name: Not on file  . Number of children: Not on file  . Years of education: Not on file  . Highest education level: Not on file  Occupational History  . Not on file  Tobacco Use  . Smoking status: Never Smoker  . Smokeless tobacco: Never Used  Substance and Sexual Activity  . Alcohol use: Yes  . Drug use: No  . Sexual activity: Not on file    Comment: Works at [employer].  Other Topics Concern  . Not on file  Social History Narrative  . Not on file   Social Determinants of Health   Financial Resource Strain:   . Difficulty of Paying Living Expenses: Not on file  Food Insecurity:   . Worried About Charity fundraiser in the Last Year: Not on file  . Ran Out of Food in the Last Year: Not on file  Transportation Needs:   . Lack of Transportation (Medical): Not on file  . Lack of Transportation (Non-Medical): Not on file  Physical Activity:   . Days of Exercise per  Week: Not on file  . Minutes of Exercise per Session: Not on file  Stress:   . Feeling of Stress : Not on file  Social Connections:   . Frequency of Communication with Friends and Family: Not on file  . Frequency of Social Gatherings with Friends and Family: Not on file  . Attends Religious Services: Not on file  . Active Member of Clubs or Organizations: Not on file  . Attends Archivist Meetings: Not on file  . Marital Status: Not on file  Intimate Partner Violence:   . Fear of Current or Ex-Partner: Not on file  . Emotionally Abused: Not on file  . Physically Abused: Not on file  . Sexually Abused: Not on file    Outpatient Medications Prior to Visit  Medication Sig Dispense Refill  . atorvastatin (LIPITOR) 20 MG tablet Take 1 tablet (20 mg total) by mouth daily. 90 tablet 3  . alendronate (FOSAMAX) 70 MG tablet Take 1 tablet (70 mg total) by mouth every 7 (seven) days. Take with a full glass of water on an empty stomach. (Patient not taking: Reported on 10/24/2019) 12 tablet 3   No facility-administered medications prior to visit.  Allergies  Allergen Reactions  . Penicillins Itching    Has patient had a PCN reaction causing immediate rash, facial/tongue/throat swelling, SOB or lightheadedness with hypotension: Y Has patient had a PCN reaction causing severe rash involving mucus membranes or skin necrosis: Y Has patient had a PCN reaction that required hospitalization: N Has patient had a PCN reaction occurring within the last 10 years: N If all of the above answers are "NO", then may proceed with Cephalosporin use.     ROS Review of Systems  All other systems reviewed and are negative.     Objective:    Physical Exam  Constitutional: She is oriented to person, place, and time. Vital signs are normal. She appears well-developed and well-nourished.  HENT:  Head: Normocephalic.  Right Ear: Hearing, tympanic membrane, external ear and ear canal normal.    Left Ear: Hearing, tympanic membrane, external ear and ear canal normal.  Nose: Nose normal.  Mouth/Throat: Uvula is midline, oropharynx is clear and moist and mucous membranes are normal.  Eyes: Pupils are equal, round, and reactive to light. Conjunctivae, EOM and lids are normal. Lids are everted and swept, no foreign bodies found.  Neck: Trachea normal. No JVD present. Carotid bruit is not present. No thyroid mass and no thyromegaly present.  Cardiovascular: Normal rate, regular rhythm, S1 normal, S2 normal, normal heart sounds and normal pulses.  Pulmonary/Chest: Effort normal and breath sounds normal.  Abdominal: Soft. Normal appearance and bowel sounds are normal. There is no abdominal tenderness. There is no CVA tenderness.  Genitourinary:    Genitourinary Comments: Pt declined   Musculoskeletal:        General: Normal range of motion.     Cervical back: Full passive range of motion without pain, normal range of motion and neck supple.  Lymphadenopathy:  No lymphanopathy  Neurological: She is alert and oriented to person, place, and time. She has normal strength. No cranial nerve deficit or sensory deficit.  Skin: Skin is warm, dry and intact.  Psychiatric: She has a normal mood and affect. Her speech is normal and behavior is normal. Judgment and thought content normal. Cognition and memory are normal.  Vitals reviewed.   BP (!) 150/72   Pulse 68   Temp (!) 97.2 F (36.2 C) (Other (Comment))   Resp 16   Ht 5\' 6"  (1.676 m)   Wt 141 lb (64 kg)   SpO2 98%   BMI 22.76 kg/m  Wt Readings from Last 3 Encounters:  10/24/19 141 lb (64 kg)  09/23/18 144 lb (65.3 kg)  02/24/18 145 lb (65.8 kg)     Health Maintenance Due  Topic Date Due  . Hepatitis C Screening  1945-10-13  . TETANUS/TDAP  12/30/1964  . PNA vac Low Risk Adult (1 of 2 - PCV13) 12/31/2010  . INFLUENZA VACCINE  04/30/2019    There are no preventive care reminders to display for this patient.  Lab Results   Component Value Date   TSH 1.172 02/16/2015   Lab Results  Component Value Date   WBC 7.2 09/23/2018   HGB 13.1 09/23/2018   HCT 40.1 09/23/2018   MCV 79.6 (L) 09/23/2018   PLT 188 09/23/2018   Lab Results  Component Value Date   NA 140 09/23/2018   K 4.8 09/23/2018   CO2 25 09/23/2018   GLUCOSE 86 09/23/2018   BUN 21 09/23/2018   CREATININE 1.13 (H) 09/23/2018   BILITOT 0.7 09/23/2018   ALKPHOS 69 02/16/2015   AST  14 09/23/2018   ALT 8 09/23/2018   PROT 6.8 09/23/2018   ALBUMIN 3.6 02/16/2015   CALCIUM 9.2 09/23/2018   Lab Results  Component Value Date   CHOL 212 (H) 09/23/2018   Lab Results  Component Value Date   HDL 56 09/23/2018   Lab Results  Component Value Date   LDLCALC 133 (H) 09/23/2018   Lab Results  Component Value Date   TRIG 120 09/23/2018   Lab Results  Component Value Date   CHOLHDL 3.8 09/23/2018   Lab Results  Component Value Date   HGBA1C 6.1 (H) 02/16/2015      Assessment & Plan:   Screening cholesterol level - Plan: CBC with Differential, COMPLETE METABOLIC PANEL WITH GFR, Lipid Panel  Screening for metabolic disorder  Osteoporosis without current pathological fracture, unspecified osteoporosis type Pt has not been taking Fosamax.Encourgaed to take as discussed prior. Cologuard not due until next year. Pt declined vaccination although recommended influenza, shingles, Tetanus, she did agree to Pneumonia vaccination today. Reccommended to schedule mammogram.   Follow-up: No follow-ups on file.    Ishmael Holter   Patient was seen with Ishmael Holter nurse practitioner.  Agree with her plan and assessment as listed above.  Encouraged the patient to use 1200 mg a day of calcium coupled with 1000 units a day of vitamin D.  She has not been compliant with Fosamax.  I have encouraged the patient to take Fosamax 70 mg weekly on an empty stomach and then drink a full glass of water.  Mammogram is due.  Patient wants to schedule her own  mammogram.  Cologuard was performed in 2019 and is not due until 2022.  She refuses a flu shot but she does eventually agree to receiving Pneumovax 23 which she received today.  Screen the patient by checking a fasting lipid panel.  She has a history of hyperlipidemia but has not been taking her atorvastatin.

## 2019-10-24 NOTE — Patient Instructions (Signed)
You should follow up in 6 months. We will call with abnormal lab results that you had drawn today.

## 2019-10-24 NOTE — Addendum Note (Signed)
Addended by: Shary Decamp B on: 10/24/2019 04:32 PM   Modules accepted: Orders

## 2019-10-25 LAB — LIPID PANEL
Cholesterol: 230 mg/dL — ABNORMAL HIGH (ref <200)
HDL: 45 mg/dL — ABNORMAL LOW (ref >=50)
LDL Cholesterol (Calc): 143 mg/dL (calc) — ABNORMAL HIGH
Non-HDL Cholesterol (Calc): 185 mg/dL (calc) — ABNORMAL HIGH (ref <130)
Total CHOL/HDL Ratio: 5.1 (calc) — ABNORMAL HIGH (ref <5.0)
Triglycerides: 271 mg/dL — ABNORMAL HIGH (ref <150)

## 2019-10-25 LAB — COMPLETE METABOLIC PANEL WITH GFR
AG Ratio: 1.5 (calc) (ref 1.0–2.5)
ALT: 9 U/L (ref 6–29)
AST: 11 U/L (ref 10–35)
Albumin: 4.1 g/dL (ref 3.6–5.1)
Alkaline phosphatase (APISO): 67 U/L (ref 37–153)
BUN/Creatinine Ratio: 17 (calc) (ref 6–22)
BUN: 18 mg/dL (ref 7–25)
CO2: 26 mmol/L (ref 20–32)
Calcium: 9.5 mg/dL (ref 8.6–10.4)
Chloride: 106 mmol/L (ref 98–110)
Creat: 1.08 mg/dL — ABNORMAL HIGH (ref 0.60–0.93)
GFR, Est African American: 59 mL/min/{1.73_m2} — ABNORMAL LOW (ref >=60)
GFR, Est Non African American: 51 mL/min/{1.73_m2} — ABNORMAL LOW (ref >=60)
Globulin: 2.8 g/dL (calc) (ref 1.9–3.7)
Glucose, Bld: 100 mg/dL — ABNORMAL HIGH (ref 65–99)
Potassium: 4.7 mmol/L (ref 3.5–5.3)
Sodium: 143 mmol/L (ref 135–146)
Total Bilirubin: 0.6 mg/dL (ref 0.2–1.2)
Total Protein: 6.9 g/dL (ref 6.1–8.1)

## 2019-10-25 LAB — CBC WITH DIFFERENTIAL/PLATELET
Absolute Monocytes: 690 cells/uL (ref 200–950)
Basophils Absolute: 41 cells/uL (ref 0–200)
Basophils Relative: 0.6 %
Eosinophils Absolute: 131 cells/uL (ref 15–500)
Eosinophils Relative: 1.9 %
HCT: 41.5 % (ref 35.0–45.0)
Hemoglobin: 13.3 g/dL (ref 11.7–15.5)
Lymphs Abs: 1635 cells/uL (ref 850–3900)
MCH: 25.6 pg — ABNORMAL LOW (ref 27.0–33.0)
MCHC: 32 g/dL (ref 32.0–36.0)
MCV: 80 fL (ref 80.0–100.0)
MPV: 9.9 fL (ref 7.5–12.5)
Monocytes Relative: 10 %
Neutro Abs: 4402 cells/uL (ref 1500–7800)
Neutrophils Relative %: 63.8 %
Platelets: 202 10*3/uL (ref 140–400)
RBC: 5.19 10*6/uL — ABNORMAL HIGH (ref 3.80–5.10)
RDW: 14.2 % (ref 11.0–15.0)
Total Lymphocyte: 23.7 %
WBC: 6.9 10*3/uL (ref 3.8–10.8)

## 2019-11-29 ENCOUNTER — Other Ambulatory Visit: Payer: Self-pay | Admitting: Family Medicine

## 2019-11-29 DIAGNOSIS — Z1231 Encounter for screening mammogram for malignant neoplasm of breast: Secondary | ICD-10-CM

## 2019-11-30 ENCOUNTER — Other Ambulatory Visit: Payer: Self-pay

## 2019-11-30 ENCOUNTER — Ambulatory Visit
Admission: RE | Admit: 2019-11-30 | Discharge: 2019-11-30 | Disposition: A | Discharge status: Home / Self Care | Payer: Medicare HMO | Source: Ambulatory Visit | Attending: Family Medicine | Admitting: Family Medicine

## 2019-11-30 DIAGNOSIS — Z1231 Encounter for screening mammogram for malignant neoplasm of breast: Secondary | ICD-10-CM | POA: Diagnosis not present

## 2020-01-06 ENCOUNTER — Encounter: Payer: Self-pay | Admitting: Family Medicine

## 2020-01-06 ENCOUNTER — Ambulatory Visit (INDEPENDENT_AMBULATORY_CARE_PROVIDER_SITE_OTHER): Payer: Medicare HMO | Admitting: Family Medicine

## 2020-01-06 ENCOUNTER — Other Ambulatory Visit: Payer: Self-pay

## 2020-01-06 VITALS — BP 152/84 | HR 76 | Temp 97.5°F | Resp 14 | Ht 66.0 in | Wt 148.0 lb

## 2020-01-06 DIAGNOSIS — L249 Irritant contact dermatitis, unspecified cause: Secondary | ICD-10-CM | POA: Diagnosis not present

## 2020-01-06 MED ORDER — MOMETASONE FUROATE 0.1 % EX CREA: 1.0000 "application " | TOPICAL_CREAM | Freq: Every day | CUTANEOUS | 0 refills | Status: DC

## 2020-01-06 NOTE — Progress Notes (Signed)
Subjective:    Patient ID: Amy Powell, female    DOB: 1946-08-16, 74 y.o.   MRN: [id number]  HPI Patient shaved her neck earlier this week on the right side.  Now she has numerous 1-2 mm papules that have developed in that area.  There are no pustules.  There are no vesicles.  They itch.  It does not look like shingles.  It is confined just to the area on the diagram.  She denies any burning or stinging pain.  It appears to be a contact allergy.  I see no evidence of folliculitis as there are no pustules or erythema or pain. Past Medical History:  Diagnosis Date  . CKD (chronic kidney disease) stage 3, GFR 30-59 ml/min   . HLD (hyperlipidemia)   . Osteoporosis    Past Surgical History:  Procedure Laterality Date  . ABDOMINAL HYSTERECTOMY     for fibroids, TAHBSO   Current Outpatient Medications on File Prior to Visit  Medication Sig Dispense Refill  . alendronate (FOSAMAX) 70 MG tablet Take 1 tablet (70 mg total) by mouth Powell 7 (seven) days. Take with a full glass of water on an empty stomach. 12 tablet 3   No current facility-administered medications on file prior to visit.   Allergies  Allergen Reactions  . Penicillins Itching    Has patient had a PCN reaction causing immediate rash, facial/tongue/throat swelling, SOB or lightheadedness with hypotension: Y Has patient had a PCN reaction causing severe rash involving mucus membranes or skin necrosis: Y Has patient had a PCN reaction that required hospitalization: N Has patient had a PCN reaction occurring within the last 10 years: N If all of the above answers are "NO", then may proceed with Cephalosporin use.    Social History   Socioeconomic History  . Marital status: Married    Spouse name: Not on file  . Number of children: Not on file  . Years of education: Not on file  . Highest education level: Not on file  Occupational History  . Not on file  Tobacco Use  . Smoking status: Never Smoker  . Smokeless  tobacco: Never Used  Substance and Sexual Activity  . Alcohol use: Yes  . Drug use: No  . Sexual activity: Not on file    Comment: Works at [employer].  Other Topics Concern  . Not on file  Social History Narrative  . Not on file   Social Determinants of Health   Financial Resource Strain:   . Difficulty of Paying Living Expenses:   Food Insecurity:   . Worried About Charity fundraiser in the Last Year:   . Arboriculturist in the Last Year:   Transportation Needs:   . Film/video editor (Medical):   Marland Kitchen Lack of Transportation (Non-Medical):   Physical Activity:   . Days of Exercise per Week:   . Minutes of Exercise per Session:   Stress:   . Feeling of Stress :   Social Connections:   . Frequency of Communication with Friends and Family:   . Frequency of Social Gatherings with Friends and Family:   . Attends Religious Services:   . Active Member of Clubs or Organizations:   . Attends Archivist Meetings:   Marland Kitchen Marital Status:   Intimate Partner Violence:   . Fear of Current or Ex-Partner:   . Emotionally Abused:   Marland Kitchen Physically Abused:   . Sexually Abused:  Review of Systems  All other systems reviewed and are negative.      Objective:   Physical Exam Vitals reviewed.  Constitutional:      General: She is not in acute distress.    Appearance: Normal appearance. She is not ill-appearing, toxic-appearing or diaphoretic.  Neck:   Cardiovascular:     Pulses: Normal pulses.     Heart sounds: Normal heart sounds.  Pulmonary:     Effort: Pulmonary effort is normal.     Breath sounds: Normal breath sounds.  Skin:    Findings: Rash present. No erythema.  Neurological:     General: No focal deficit present.     Mental Status: She is alert.     Cranial Nerves: No cranial nerve deficit.     Motor: No weakness.     Coordination: Coordination normal.           Assessment & Plan:  Irritant dermatitis  This appears to be an irritant  dermatitis.  Less likely would be an atypical folliculitis.  Start by using Elocon cream applied once daily for the next 7 days.  If worsening, consider folliculitis.  I doubt shingles given the fact that occurred after shaving that area.

## 2020-01-20 DIAGNOSIS — H903 Sensorineural hearing loss, bilateral: Secondary | ICD-10-CM | POA: Insufficient documentation

## 2020-01-20 DIAGNOSIS — H9313 Tinnitus, bilateral: Secondary | ICD-10-CM | POA: Diagnosis not present

## 2020-03-23 ENCOUNTER — Telehealth: Payer: Self-pay

## 2020-03-23 NOTE — Telephone Encounter (Signed)
I don't think the fosamax is causing her stiff neck. I be gld to see her to discuss and figure out what is going on.

## 2020-03-23 NOTE — Telephone Encounter (Signed)
Pt called to let us know she's getting off her Fosamax, she stated it's making her stiff in her kneck and shoulder. I asked did her provider instruct her to stop the fosamax if this happened, she stated No but she read were the side affects would cause stiffness.

## 2020-04-03 NOTE — Telephone Encounter (Signed)
Pt was notified of message given be her PCP

## 2020-05-22 DIAGNOSIS — R030 Elevated blood-pressure reading, without diagnosis of hypertension: Secondary | ICD-10-CM | POA: Diagnosis not present

## 2020-05-22 DIAGNOSIS — Z809 Family history of malignant neoplasm, unspecified: Secondary | ICD-10-CM | POA: Diagnosis not present

## 2020-05-22 DIAGNOSIS — Z8249 Family history of ischemic heart disease and other diseases of the circulatory system: Secondary | ICD-10-CM | POA: Diagnosis not present

## 2020-06-08 DIAGNOSIS — H524 Presbyopia: Secondary | ICD-10-CM | POA: Diagnosis not present

## 2020-06-10 DIAGNOSIS — Z0100 Encounter for examination of eyes and vision without abnormal findings: Secondary | ICD-10-CM | POA: Diagnosis not present

## 2020-10-19 ENCOUNTER — Other Ambulatory Visit: Payer: Self-pay | Admitting: Family Medicine

## 2020-10-19 DIAGNOSIS — Z1231 Encounter for screening mammogram for malignant neoplasm of breast: Secondary | ICD-10-CM

## 2020-12-03 ENCOUNTER — Other Ambulatory Visit: Payer: Self-pay

## 2020-12-03 ENCOUNTER — Ambulatory Visit
Admission: RE | Admit: 2020-12-03 | Discharge: 2020-12-03 | Disposition: A | Discharge status: Home / Self Care | Payer: Medicare HMO | Source: Ambulatory Visit | Attending: Family Medicine | Admitting: Family Medicine

## 2020-12-03 DIAGNOSIS — Z1231 Encounter for screening mammogram for malignant neoplasm of breast: Secondary | ICD-10-CM

## 2021-01-18 ENCOUNTER — Other Ambulatory Visit: Payer: Self-pay

## 2021-01-18 ENCOUNTER — Encounter: Payer: Self-pay | Admitting: Family Medicine

## 2021-01-18 ENCOUNTER — Ambulatory Visit (INDEPENDENT_AMBULATORY_CARE_PROVIDER_SITE_OTHER): Payer: Medicare HMO | Admitting: Family Medicine

## 2021-01-18 VITALS — BP 138/70 | HR 62 | Temp 98.1°F | Resp 14 | Ht 66.0 in | Wt 148.0 lb

## 2021-01-18 DIAGNOSIS — R053 Chronic cough: Secondary | ICD-10-CM | POA: Diagnosis not present

## 2021-01-18 MED ORDER — PANTOPRAZOLE SODIUM 40 MG PO TBEC: 40.0000 mg | DELAYED_RELEASE_TABLET | Freq: Every day | ORAL | 3 refills | Status: DC

## 2021-01-18 NOTE — Progress Notes (Signed)
Subjective:    Patient ID: Amy Powell, female    DOB: 14-May-1946, 75 y.o.   MRN: [id number]  HPI  Patient is a very pleasant 75 year old African-American female who presents today with a chronic complaint.  She states that since January of last year 2021 she feels like she constantly has phlegm in her throat.  She points to the area of her thyroid gland.  She states that she is constantly having to clear her throat.  She feels like there is phlegm and mucus in her throat that she cannot cough up.  She denies any hemoptysis.  She denies any cough.  However she feels like she constantly has to clear her throat.  She denies any dysphagia or stridor.  She denies any food sticking when she swallows.  She denies any hematemesis.  She does not smoke or chew tobacco.  She denies any weight loss.  She does report heartburn on almost a daily basis.  Past Medical History:  Diagnosis Date  . CKD (chronic kidney disease) stage 3, GFR 30-59 ml/min (HCC)   . HLD (hyperlipidemia)   . Osteoporosis    Past Surgical History:  Procedure Laterality Date  . ABDOMINAL HYSTERECTOMY     for fibroids, TAHBSO    Current Outpatient Medications on File Prior to Visit  Medication Sig Dispense Refill  . alendronate (FOSAMAX) 70 MG tablet Take 1 tablet (70 mg total) by mouth Powell 7 (seven) days. Take with a full glass of water on an empty stomach. (Patient not taking: Reported on 01/18/2021) 12 tablet 3  . mometasone (ELOCON) 0.1 % cream Apply 1 application topically daily. 45 g 0   No current facility-administered medications on file prior to visit.   Allergies  Allergen Reactions  . Penicillins Itching    Has patient had a PCN reaction causing immediate rash, facial/tongue/throat swelling, SOB or lightheadedness with hypotension: Y Has patient had a PCN reaction causing severe rash involving mucus membranes or skin necrosis: Y Has patient had a PCN reaction that required hospitalization: N Has patient had  a PCN reaction occurring within the last 10 years: N If all of the above answers are "NO", then may proceed with Cephalosporin use.    Social History   Socioeconomic History  . Marital status: Married    Spouse name: Not on file  . Number of children: Not on file  . Years of education: Not on file  . Highest education level: Not on file  Occupational History  . Not on file  Tobacco Use  . Smoking status: Never Smoker  . Smokeless tobacco: Never Used  Substance and Sexual Activity  . Alcohol use: Yes  . Drug use: No  . Sexual activity: Not on file    Comment: Works at [employer].  Other Topics Concern  . Not on file  Social History Narrative  . Not on file   Social Determinants of Health   Financial Resource Strain: Not on file  Food Insecurity: Not on file  Transportation Needs: Not on file  Physical Activity: Not on file  Stress: Not on file  Social Connections: Not on file  Intimate Partner Violence: Not on file    Family History  Problem Relation Age of Onset  . Hypertension Mother   . Stroke Mother   . Diabetes Mother   . Alcohol abuse Father   . Hypertension Father   . Heart disease Brother   . Hypertension Sister   . Diabetes Sister   .  Breast cancer Neg Hx     Review of Systems  All other systems reviewed and are negative.      Objective:   Physical Exam Vitals reviewed.  Constitutional:      General: She is not in acute distress.    Appearance: She is well-developed. She is not diaphoretic.  HENT:     Head: Normocephalic and atraumatic.     Right Ear: External ear normal.     Left Ear: External ear normal.     Nose: Nose normal.     Mouth/Throat:     Mouth: Mucous membranes are moist.     Pharynx: Oropharynx is clear. No oropharyngeal exudate or posterior oropharyngeal erythema.  Eyes:     General: No scleral icterus.       Right eye: No discharge.        Left eye: No discharge.     Conjunctiva/sclera: Conjunctivae normal.     Pupils:  Pupils are equal, round, and reactive to light.  Neck:     Thyroid: No thyromegaly.     Vascular: No JVD.     Trachea: No tracheal deviation.  Cardiovascular:     Rate and Rhythm: Normal rate and regular rhythm.     Heart sounds: Normal heart sounds. No murmur heard. No friction rub. No gallop.   Pulmonary:     Effort: Pulmonary effort is normal. No respiratory distress.     Breath sounds: Normal breath sounds. No stridor. No wheezing or rales.  Chest:     Chest wall: No tenderness.  Abdominal:     General: Bowel sounds are normal. There is no distension.     Palpations: Abdomen is soft. There is no mass.     Tenderness: There is no abdominal tenderness. There is no guarding or rebound.  Musculoskeletal:        General: No tenderness. Normal range of motion.     Cervical back: Normal range of motion and neck supple.  Lymphadenopathy:     Cervical: No cervical adenopathy.  Skin:    General: Skin is warm.     Coloration: Skin is not pale.     Findings: No erythema or rash.  Neurological:     Mental Status: She is alert and oriented to person, place, and time.     Cranial Nerves: No cranial nerve deficit.     Motor: No abnormal muscle tone.     Coordination: Coordination normal.     Deep Tendon Reflexes: Reflexes are normal and symmetric.  Psychiatric:        Behavior: Behavior normal.        Thought Content: Thought content normal.        Judgment: Judgment normal.         Assessment & Plan:   Chronic cough  Symptoms have been present now for almost 14 months.  She denies any weight loss or hemoptysis or fever or chills or dysphagia or hematemesis.  Therefore I believe this is most likely due to silent reflux/laryngal esophageal reflux.  I recommended trying Protonix 40 mg a day.  Also recommended that she elevate the head of her bed 2 inches to sleep on a slight incline.  Reassess in 2 to 3 weeks.  If symptoms or not improving at that point, I would recommend ENT  consultation for direct laryngoscopy

## 2021-03-28 ENCOUNTER — Ambulatory Visit (INDEPENDENT_AMBULATORY_CARE_PROVIDER_SITE_OTHER): Payer: Medicare HMO

## 2021-03-28 ENCOUNTER — Other Ambulatory Visit: Payer: Self-pay

## 2021-03-28 VITALS — BP 110/70 | Temp 97.6°F | Ht 66.0 in | Wt 148.0 lb

## 2021-03-28 DIAGNOSIS — Z0000 Encounter for general adult medical examination without abnormal findings: Secondary | ICD-10-CM

## 2021-03-28 NOTE — Progress Notes (Signed)
Subjective:   Amy Powell is a 75 y.o. female who presents for Medicare Annual (Subsequent) preventive examination.  Review of Systems    N/A  Cardiac Risk Factors include: advanced age (>25men, >33 women);dyslipidemia     Objective:    Today's Vitals   03/28/21 0827  BP: 110/70  Temp: 97.6 F (36.4 C)  TempSrc: Temporal  Weight: 148 lb (67.1 kg)  Height: 5\' 6"  (1.676 m)   Body mass index is 23.89 kg/m.  Advanced Directives 03/28/2021 09/23/2018  Does Patient Have a Medical Advance Directive? No No  Does patient want to make changes to medical advance directive? No - Patient declined -  Would patient like information on creating a medical advance directive? - Yes (MAU/Ambulatory/Procedural Areas - Information given)    Current Medications (verified) Outpatient Encounter Medications as of 03/28/2021  Medication Sig   Cholecalciferol (VITAMIN D3) 125 MCG (5000 UT) TBDP Take by mouth.   Multiple Vitamin (MULTIVITAMIN WITH MINERALS) TABS tablet Take 1 tablet by mouth daily.   pantoprazole (PROTONIX) 40 MG tablet Take 1 tablet (40 mg total) by mouth daily. (Patient not taking: Reported on 03/28/2021)   No facility-administered encounter medications on file as of 03/28/2021.    Allergies (verified) Penicillins   History: Past Medical History:  Diagnosis Date   CKD (chronic kidney disease) stage 3, GFR 30-59 ml/min (HCC)    HLD (hyperlipidemia)    Osteoporosis    Past Surgical History:  Procedure Laterality Date   ABDOMINAL HYSTERECTOMY     for fibroids, TAHBSO   Family History  Problem Relation Age of Onset   Hypertension Mother    Stroke Mother    Diabetes Mother    Alcohol abuse Father    Hypertension Father    Heart disease Brother    Hypertension Sister    Diabetes Sister    Breast cancer Neg Hx    Social History   Socioeconomic History   Marital status: Married    Spouse name: Not on file   Number of children: Not on file   Years of  education: Not on file   Highest education level: Not on file  Occupational History   Not on file  Tobacco Use   Smoking status: Never   Smokeless tobacco: Never  Substance and Sexual Activity   Alcohol use: Yes   Drug use: No   Sexual activity: Not on file    Comment: Works at [employer].  Other Topics Concern   Not on file  Social History Narrative   Not on file   Social Determinants of Health   Financial Resource Strain: Low Risk    Difficulty of Paying Living Expenses: Not hard at all  Food Insecurity: No Food Insecurity   Worried About Charity fundraiser in the Last Year: Never true   [place] in the Last Year: Never true  Transportation Needs: No Transportation Needs   Lack of Transportation (Medical): No   Lack of Transportation (Non-Medical): No  Physical Activity: Sufficiently Active   Days of Exercise per Week: 7 days   Minutes of Exercise per Session: 30 min  Stress: No Stress Concern Present   Feeling of Stress : Not at all  Social Connections: Moderately Isolated   Frequency of Communication with Friends and Family: More than three times a week   Frequency of Social Gatherings with Friends and Family: Once a week   Attends Religious Services: More than 4 times per year  Active Member of Clubs or Organizations: No   Attends Archivist Meetings: Never   Marital Status: Never married    Tobacco Counseling Counseling given: Not Answered   Clinical Intake:  Pre-visit preparation completed: Yes  Pain : No/denies pain     Diabetes: No  How often do you need to have someone help you when you read instructions, pamphlets, or other written materials from your doctor or pharmacy?: 1 - Never  Diabetic?No   Interpreter Needed?: No  Information entered by :: [place] of Daily Living In your present state of health, do you have any difficulty performing the following activities: 03/28/2021  Hearing? Y  Vision? N   Difficulty concentrating or making decisions? Y  Comment has some issues remembering at times  Walking or climbing stairs? N  Dressing or bathing? N  Doing errands, shopping? N  Preparing Food and eating ? N  Using the Toilet? N  In the past six months, have you accidently leaked urine? N  Do you have problems with loss of bowel control? N  Managing your Medications? N  Managing your Finances? N  Housekeeping or managing your Housekeeping? N  Some recent data might be hidden    Patient Care Team: Susy Frizzle, MD as PCP - General (Family Medicine)  Indicate any recent Medical Services you may have received from other than [facility] providers in the past year (date may be approximate).     Assessment:   This is a routine wellness examination for [facility].  Hearing/Vision screen Vision Screening - Comments:: Patient states gets eyes examined every 2 years currently due for an eye exam at this time. Currently wears reading glasses   Dietary issues and exercise activities discussed: Current Exercise Habits: Home exercise routine, Type of exercise: strength training/weights;stretching;yoga, Time (Minutes): 30, Frequency (Times/Week): 7, Weekly Exercise (Minutes/Week): 210, Intensity: Moderate   Goals Addressed             This Visit's Progress    Prevent falls         Depression Screen PHQ 2/9 Scores 03/28/2021 01/18/2021 10/24/2019 10/24/2019 09/23/2018  PHQ - 2 Score 0 0 0 0 0  PHQ- 9 Score - - 0 - -    Fall Risk Fall Risk  03/28/2021 01/18/2021 10/24/2019 10/24/2019 09/23/2018  Falls in the past year? 0 0 0 0 1  Number falls in past yr: 0 - - - 0  Injury with Fall? 0 - - - 1  Risk for fall due to : No Fall Risks No Fall Risks - - History of fall(s)  Follow up Falls evaluation completed;Falls prevention discussed Falls evaluation completed - Falls evaluation completed Falls evaluation completed    FALL RISK PREVENTION PERTAINING TO THE HOME:  Any stairs in or around the  home? Yes  If so, are there any without handrails? No  Home free of loose throw rugs in walkways, pet beds, electrical cords, etc? Yes  Adequate lighting in your home to reduce risk of falls? Yes   ASSISTIVE DEVICES UTILIZED TO PREVENT FALLS:  Life alert? No  Use of a cane, walker or w/c? No  Grab bars in the bathroom? Yes  Shower chair or bench in shower? No  Elevated toilet seat or a handicapped toilet? No   TIMED UP AND GO:  Was the test performed? Yes .  Length of time to ambulate 10 feet: 3 sec.   Gait steady and fast without use of assistive device  Cognitive Function:     6CIT Screen 03/28/2021  What Year? 0 points  What month? 0 points  What time? 0 points  Count back from 20 0 points  Months in reverse 0 points  Repeat phrase 4 points  Total Score 4    Immunizations Immunization History  Administered Date(s) Administered   Janssen (J&J) SARS-COV-2 Vaccination 01/09/2020   Moderna Sars-Covid-2 Vaccination 08/28/2020   Pneumococcal Polysaccharide-23 10/24/2019    TDAP status: Due, Education has been provided regarding the importance of this vaccine. Advised may receive this vaccine at local pharmacy or Health Dept. Aware to provide a copy of the vaccination record if obtained from local pharmacy or Health Dept. Verbalized acceptance and understanding.  Flu Vaccine status: Declined, Education has been provided regarding the importance of this vaccine but patient still declined. Advised may receive this vaccine at local pharmacy or Health Dept. Aware to provide a copy of the vaccination record if obtained from local pharmacy or Health Dept. Verbalized acceptance and understanding.  Pneumococcal vaccine status: Declined,  Education has been provided regarding the importance of this vaccine but patient still declined. Advised may receive this vaccine at local pharmacy or Health Dept. Aware to provide a copy of the vaccination record if obtained from local pharmacy or  Health Dept. Verbalized acceptance and understanding.   Covid-19 vaccine status: Completed vaccines  Qualifies for Shingles Vaccine? Yes   Zostavax completed No   Shingrix Completed?: No.    Education has been provided regarding the importance of this vaccine. Patient has been advised to call insurance company to determine out of pocket expense if they have not yet received this vaccine. Advised may also receive vaccine at local pharmacy or Health Dept. Verbalized acceptance and understanding.  Screening Tests Health Maintenance  Topic Date Due   Hepatitis C Screening  Never done   TETANUS/TDAP  Never done   Zoster Vaccines- Shingrix (1 of 2) Never done   PNA vac Low Risk Adult (2 of 2 - PCV13) 10/23/2020   COVID-19 Vaccine (3 - Booster for Janssen series) 12/26/2020   INFLUENZA VACCINE  04/29/2021   Fecal DNA (Cologuard)  09/10/2021   DEXA SCAN  Completed   HPV VACCINES  Aged Out    Health Maintenance  Health Maintenance Due  Topic Date Due   Hepatitis C Screening  Never done   TETANUS/TDAP  Never done   Zoster Vaccines- Shingrix (1 of 2) Never done   PNA vac Low Risk Adult (2 of 2 - PCV13) 10/23/2020   COVID-19 Vaccine (3 - Booster for Janssen series) 12/26/2020    Colorectal cancer screening: Type of screening: Cologuard. Completed 09/10/2018. Repeat every 3 years  Mammogram status: Completed 12/03/2020. Repeat every year  Bone Density status: Completed 04/14/2019. Results reflect: Bone density results: OSTEOPOROSIS. Repeat every 2 years.  Lung Cancer Screening: (Low Dose CT Chest recommended if Age 61-80 years, 30 pack-year currently smoking OR have quit w/in 15years.) does not qualify.   Lung Cancer Screening Referral: N/A   Additional Screening:  Hepatitis C Screening: does qualify;   Vision Screening: Recommended annual ophthalmology exams for early detection of glaucoma and other disorders of the eye. Is the patient up to date with their annual eye exam?  No   Who is the provider or what is the name of the office in which the patient attends annual eye exams? Patient unsure of last eye doctors name  If pt is not established with a provider, would they like to be  referred to a provider to establish care? No .   Dental Screening: Recommended annual dental exams for proper oral hygiene  Community Resource Referral / Chronic Care Management: CRR required this visit?  No   CCM required this visit?  No      Plan:     I have personally reviewed and noted the following in the patient's chart:   Medical and social history Use of alcohol, tobacco or illicit drugs  Current medications and supplements including opioid prescriptions.  Functional ability and status Nutritional status Physical activity Advanced directives List of other physicians Hospitalizations, surgeries, and ER visits in previous 12 months Vitals Screenings to include cognitive, depression, and falls Referrals and appointments  In addition, I have reviewed and discussed with patient certain preventive protocols, quality metrics, and best practice recommendations. A written personalized care plan for preventive services as well as general preventive health recommendations were provided to patient.     Ofilia Neas, LPN   [id number]   Nurse Notes: None

## 2021-03-28 NOTE — Patient Instructions (Signed)
Amy Powell , Thank you for taking time to come for your Medicare Wellness Visit. I appreciate your ongoing commitment to your health goals. Please review the following plan we discussed and let me know if I can assist you in the future.   Screening recommendations/referrals: Colonoscopy: Up to date, next due 09/13/2021 Mammogram: Up to date, next due 12/03/2021 Bone Density: Up to date, next due 04/17/2021 Recommended yearly ophthalmology/optometry visit for glaucoma screening and checkup Recommended yearly dental visit for hygiene and checkup  Vaccinations: Influenza vaccine: Patient declined  Pneumococcal vaccine: You declined second pneumonia vaccine, if you change your mind we can get it for you at your next office visit Tdap vaccine: Currently due, you may await and injury to receive  Shingles vaccine: Currently due, if you would like to receive we recommend that you do so at your local pharmacy    Advanced directives: Advance directive discussed with you today. Even though you declined this today please call our office should you change your mind and we can give you the proper paperwork for you to fill out.   Conditions/risks identified: None   Next appointment: None    Preventive Care 65 Years and Older, Female Preventive care refers to lifestyle choices and visits with your health care provider that can promote health and wellness. What does preventive care include? A yearly physical exam. This is also called an annual well check. Dental exams once or twice a year. Routine eye exams. Ask your health care provider how often you should have your eyes checked. Personal lifestyle choices, including: Daily care of your teeth and gums. Regular physical activity. Eating a healthy diet. Avoiding tobacco and drug use. Limiting alcohol use. Practicing safe sex. Taking low-dose aspirin every day. Taking vitamin and mineral supplements as recommended by your health care  provider. What happens during an annual well check? The services and screenings done by your health care provider during your annual well check will depend on your age, overall health, lifestyle risk factors, and family history of disease. Counseling  Your health care provider may ask you questions about your: Alcohol use. Tobacco use. Drug use. Emotional well-being. Home and relationship well-being. Sexual activity. Eating habits. History of falls. Memory and ability to understand (cognition). Work and work Statistician. Reproductive health. Screening  You may have the following tests or measurements: Height, weight, and BMI. Blood pressure. Lipid and cholesterol levels. These may be checked every 5 years, or more frequently if you are over 85 years old. Skin check. Lung cancer screening. You may have this screening every year starting at age 68 if you have a 30-pack-year history of smoking and currently smoke or have quit within the past 15 years. Fecal occult blood test (FOBT) of the stool. You may have this test every year starting at age 85. Flexible sigmoidoscopy or colonoscopy. You may have a sigmoidoscopy every 5 years or a colonoscopy every 10 years starting at age 33. Hepatitis C blood test. Hepatitis B blood test. Sexually transmitted disease (STD) testing. Diabetes screening. This is done by checking your blood sugar (glucose) after you have not eaten for a while (fasting). You may have this done every 1-3 years. Bone density scan. This is done to screen for osteoporosis. You may have this done starting at age 103. Mammogram. This may be done every 1-2 years. Talk to your health care provider about how often you should have regular mammograms. Talk with your health care provider about your test results, treatment options,  and if necessary, the need for more tests. Vaccines  Your health care provider may recommend certain vaccines, such as: Influenza vaccine. This is  recommended every year. Tetanus, diphtheria, and acellular pertussis (Tdap, Td) vaccine. You may need a Td booster every 10 years. Zoster vaccine. You may need this after age 50. Pneumococcal 13-valent conjugate (PCV13) vaccine. One dose is recommended after age 95. Pneumococcal polysaccharide (PPSV23) vaccine. One dose is recommended after age 72. Talk to your health care provider about which screenings and vaccines you need and how often you need them. This information is not intended to replace advice given to you by your health care provider. Make sure you discuss any questions you have with your health care provider. Document Released: 10/12/2015 Document Revised: 06/04/2016 Document Reviewed: 07/17/2015 Elsevier Interactive Patient Education  2017 [employer] Prevention in the Home Falls can cause injuries. They can happen to people of all ages. There are many things you can do to make your home safe and to help prevent falls. What can I do on the outside of my home? Regularly fix the edges of walkways and driveways and fix any cracks. Remove anything that might make you trip as you walk through a door, such as a raised step or threshold. Trim any bushes or trees on the path to your home. Use bright outdoor lighting. Clear any walking paths of anything that might make someone trip, such as rocks or tools. Regularly check to see if handrails are loose or broken. Make sure that both sides of any steps have handrails. Any raised decks and porches should have guardrails on the edges. Have any leaves, snow, or ice cleared regularly. Use sand or salt on walking paths during winter. Clean up any spills in your garage right away. This includes oil or grease spills. What can I do in the bathroom? Use night lights. Install grab bars by the toilet and in the tub and shower. Do not use towel bars as grab bars. Use non-skid mats or decals in the tub or shower. If you need to sit down in  the shower, use a plastic, non-slip stool. Keep the floor dry. Clean up any water that spills on the floor as soon as it happens. Remove soap buildup in the tub or shower regularly. Attach bath mats securely with double-sided non-slip rug tape. Do not have throw rugs and other things on the floor that can make you trip. What can I do in the bedroom? Use night lights. Make sure that you have a light by your bed that is easy to reach. Do not use any sheets or blankets that are too big for your bed. They should not hang down onto the floor. Have a firm chair that has side arms. You can use this for support while you get dressed. Do not have throw rugs and other things on the floor that can make you trip. What can I do in the kitchen? Clean up any spills right away. Avoid walking on wet floors. Keep items that you use a lot in easy-to-reach places. If you need to reach something above you, use a strong step stool that has a grab bar. Keep electrical cords out of the way. Do not use floor polish or wax that makes floors slippery. If you must use wax, use non-skid floor wax. Do not have throw rugs and other things on the floor that can make you trip. What can I do with my stairs? Do not leave  any items on the stairs. Make sure that there are handrails on both sides of the stairs and use them. Fix handrails that are broken or loose. Make sure that handrails are as long as the stairways. Check any carpeting to make sure that it is firmly attached to the stairs. Fix any carpet that is loose or worn. Avoid having throw rugs at the top or bottom of the stairs. If you do have throw rugs, attach them to the floor with carpet tape. Make sure that you have a light switch at the top of the stairs and the bottom of the stairs. If you do not have them, ask someone to add them for you. What else can I do to help prevent falls? Wear shoes that: Do not have high heels. Have rubber bottoms. Are comfortable  and fit you well. Are closed at the toe. Do not wear sandals. If you use a stepladder: Make sure that it is fully opened. Do not climb a closed stepladder. Make sure that both sides of the stepladder are locked into place. Ask someone to hold it for you, if possible. Clearly mark and make sure that you can see: Any grab bars or handrails. First and last steps. Where the edge of each step is. Use tools that help you move around (mobility aids) if they are needed. These include: Canes. Walkers. Scooters. Crutches. Turn on the lights when you go into a dark area. Replace any light bulbs as soon as they burn out. Set up your furniture so you have a clear path. Avoid moving your furniture around. If any of your floors are uneven, fix them. If there are any pets around you, be aware of where they are. Review your medicines with your doctor. Some medicines can make you feel dizzy. This can increase your chance of falling. Ask your doctor what other things that you can do to help prevent falls. This information is not intended to replace advice given to you by your health care provider. Make sure you discuss any questions you have with your health care provider. Document Released: 07/12/2009 Document Revised: 02/21/2016 Document Reviewed: 10/20/2014 Elsevier Interactive Patient Education  2017 [employer].

## 2021-05-14 DIAGNOSIS — K08109 Complete loss of teeth, unspecified cause, unspecified class: Secondary | ICD-10-CM | POA: Diagnosis not present

## 2021-05-14 DIAGNOSIS — Z823 Family history of stroke: Secondary | ICD-10-CM | POA: Diagnosis not present

## 2021-05-14 DIAGNOSIS — E785 Hyperlipidemia, unspecified: Secondary | ICD-10-CM | POA: Diagnosis not present

## 2021-05-14 DIAGNOSIS — K5900 Constipation, unspecified: Secondary | ICD-10-CM | POA: Diagnosis not present

## 2021-05-14 DIAGNOSIS — Z833 Family history of diabetes mellitus: Secondary | ICD-10-CM | POA: Diagnosis not present

## 2021-05-14 DIAGNOSIS — Z803 Family history of malignant neoplasm of breast: Secondary | ICD-10-CM | POA: Diagnosis not present

## 2021-05-14 DIAGNOSIS — R030 Elevated blood-pressure reading, without diagnosis of hypertension: Secondary | ICD-10-CM | POA: Diagnosis not present

## 2021-05-14 DIAGNOSIS — Z8249 Family history of ischemic heart disease and other diseases of the circulatory system: Secondary | ICD-10-CM | POA: Diagnosis not present

## 2021-05-14 DIAGNOSIS — Z008 Encounter for other general examination: Secondary | ICD-10-CM | POA: Diagnosis not present

## 2021-05-14 DIAGNOSIS — M810 Age-related osteoporosis without current pathological fracture: Secondary | ICD-10-CM | POA: Diagnosis not present

## 2021-05-14 DIAGNOSIS — R69 Illness, unspecified: Secondary | ICD-10-CM | POA: Diagnosis not present

## 2021-06-15 DIAGNOSIS — H524 Presbyopia: Secondary | ICD-10-CM | POA: Diagnosis not present

## 2021-06-27 ENCOUNTER — Telehealth: Payer: Self-pay | Admitting: Family Medicine

## 2021-06-27 NOTE — Telephone Encounter (Signed)
Patient requesting results from the colorguard. She states she completed this on 05/13/2021. I have told patient that Margreta Journey is out of the office for the rest of this week but she would address next week.  CB# [phone]

## 2021-07-01 NOTE — Telephone Encounter (Signed)
Call placed to patient.   Patient states that she was seen at home by [facility] nurse and stool card was performed. States that she received call later stating card noted +.   Advised to schedule appointment for F/U of blood in stool.   Noted that Cologuard is not recommended until Dec 2022.

## 2021-07-02 ENCOUNTER — Other Ambulatory Visit: Payer: Self-pay

## 2021-07-02 ENCOUNTER — Ambulatory Visit (INDEPENDENT_AMBULATORY_CARE_PROVIDER_SITE_OTHER): Payer: Medicare HMO | Admitting: Family Medicine

## 2021-07-02 ENCOUNTER — Encounter: Payer: Self-pay | Admitting: Family Medicine

## 2021-07-02 VITALS — BP 130/76 | HR 58 | Temp 98.6°F | Resp 16 | Ht 66.0 in | Wt 142.0 lb

## 2021-07-02 DIAGNOSIS — R195 Other fecal abnormalities: Secondary | ICD-10-CM | POA: Diagnosis not present

## 2021-07-02 NOTE — Progress Notes (Signed)
Subjective:    Patient ID: Amy Powell, female    DOB: 29-Dec-1945, 75 y.o.   MRN: [id number]  HPI  Patient recently had an annual wellness visit at home by her insurance company.  They performed a fit test which was positive.  The patient denies any abdominal pain.  She denies any melena or hematochezia.  She denies any reflux or epigastric pain.  She states that she has been constipated recently but she denies any pain with defecation  Past Medical History:  Diagnosis Date   CKD (chronic kidney disease) stage 3, GFR 30-59 ml/min (HCC)    HLD (hyperlipidemia)    Osteoporosis    Past Surgical History:  Procedure Laterality Date   ABDOMINAL HYSTERECTOMY     for fibroids, TAHBSO    Current Outpatient Medications on File Prior to Visit  Medication Sig Dispense Refill   Cholecalciferol (VITAMIN D3) 125 MCG (5000 UT) TBDP Take by mouth.     No current facility-administered medications on file prior to visit.   Allergies  Allergen Reactions   Penicillins Itching    Has patient had a PCN reaction causing immediate rash, facial/tongue/throat swelling, SOB or lightheadedness with hypotension: Y Has patient had a PCN reaction causing severe rash involving mucus membranes or skin necrosis: Y Has patient had a PCN reaction that required hospitalization: N Has patient had a PCN reaction occurring within the last 10 years: N If all of the above answers are "NO", then may proceed with Cephalosporin use.    Social History   Socioeconomic History   Marital status: Married    Spouse name: Not on file   Number of children: Not on file   Years of education: Not on file   Highest education level: Not on file  Occupational History   Not on file  Tobacco Use   Smoking status: Never   Smokeless tobacco: Never  Substance and Sexual Activity   Alcohol use: Yes   Drug use: No   Sexual activity: Not on file    Comment: Works at [employer].  Other Topics Concern   Not on file  Social  History Narrative   Not on file   Social Determinants of Health   Financial Resource Strain: Low Risk    Difficulty of Paying Living Expenses: Not hard at all  Food Insecurity: No Food Insecurity   Worried About Charity fundraiser in the Last Year: Never true   [place] in the Last Year: Never true  Transportation Needs: No Transportation Needs   Lack of Transportation (Medical): No   Lack of Transportation (Non-Medical): No  Physical Activity: Sufficiently Active   Days of Exercise per Week: 7 days   Minutes of Exercise per Session: 30 min  Stress: No Stress Concern Present   Feeling of Stress : Not at all  Social Connections: Moderately Isolated   Frequency of Communication with Friends and Family: More than three times a week   Frequency of Social Gatherings with Friends and Family: Once a week   Attends Religious Services: More than 4 times per year   Active Member of [employer] or Organizations: No   Attends Archivist Meetings: Never   Marital Status: Never married  Human resources officer Violence: Not At Risk   Fear of Current or Ex-Partner: No   Emotionally Abused: No   Physically Abused: No   Sexually Abused: No    Family History  Problem Relation Age of Onset   Hypertension  Mother    Stroke Mother    Diabetes Mother    Alcohol abuse Father    Hypertension Father    Heart disease Brother    Hypertension Sister    Diabetes Sister    Breast cancer Neg Hx     Review of Systems  All other systems reviewed and are negative.     Objective:   Physical Exam Vitals reviewed.  Constitutional:      General: She is not in acute distress.    Appearance: She is well-developed. She is not diaphoretic.  HENT:     Head: Normocephalic and atraumatic.  Neck:     Thyroid: No thyromegaly.     Vascular: No JVD.     Trachea: No tracheal deviation.  Cardiovascular:     Rate and Rhythm: Normal rate and regular rhythm.     Heart sounds: Normal heart sounds. No  murmur heard.   No friction rub. No gallop.  Pulmonary:     Effort: Pulmonary effort is normal. No respiratory distress.     Breath sounds: Normal breath sounds. No stridor. No wheezing or rales.  Chest:     Chest wall: No tenderness.  Abdominal:     General: Bowel sounds are normal. There is no distension.     Palpations: Abdomen is soft. There is no mass.     Tenderness: There is no abdominal tenderness. There is no guarding or rebound.  Neurological:     Mental Status: She is alert.     Motor: No abnormal muscle tone.     Deep Tendon Reflexes: Reflexes are normal and symmetric.  Psychiatric:        Judgment: Judgment normal.   Patient declines digital rectal exam     Assessment & Plan:   Positive occult stool blood test - Plan: Ambulatory referral to Gastroenterology, CBC with Differential/Platelet, COMPLETE METABOLIC PANEL WITH GFR  Patient had a negative Cologuard test in 2019.  Therefore the patient has 3 options.  Option #1 as I recommended we treat her chronic constipation with Linzess 145 mcg daily and then recheck FIT cards x3.  If the blood persist I would recommend GI consultation.  Option #2 is repeat Cologuard.  However I explained to the patient that blood in the stool would likely cause a false positive.  Option #3 is GI referral for colonoscopy.  Patient elects option #3.  I will check CBC and CMP to rule out severe anemia.

## 2021-07-03 LAB — CBC WITH DIFFERENTIAL/PLATELET
Absolute Monocytes: 765 cells/uL (ref 200–950)
Basophils Absolute: 51 cells/uL (ref 0–200)
Basophils Relative: 0.5 %
Eosinophils Absolute: 184 cells/uL (ref 15–500)
Eosinophils Relative: 1.8 %
HCT: 37.4 % (ref 35.0–45.0)
Hemoglobin: 12.3 g/dL (ref 11.7–15.5)
Lymphs Abs: 2601 cells/uL (ref 850–3900)
MCH: 26.1 pg — ABNORMAL LOW (ref 27.0–33.0)
MCHC: 32.9 g/dL (ref 32.0–36.0)
MCV: 79.4 fL — ABNORMAL LOW (ref 80.0–100.0)
MPV: 9.7 fL (ref 7.5–12.5)
Monocytes Relative: 7.5 %
Neutro Abs: 6599 cells/uL (ref 1500–7800)
Neutrophils Relative %: 64.7 %
Platelets: 214 10*3/uL (ref 140–400)
RBC: 4.71 10*6/uL (ref 3.80–5.10)
RDW: 14.4 % (ref 11.0–15.0)
Total Lymphocyte: 25.5 %
WBC: 10.2 10*3/uL (ref 3.8–10.8)

## 2021-07-03 LAB — COMPLETE METABOLIC PANEL WITH GFR
AG Ratio: 1.5 (calc) (ref 1.0–2.5)
ALT: 6 U/L (ref 6–29)
AST: 13 U/L (ref 10–35)
Albumin: 4 g/dL (ref 3.6–5.1)
Alkaline phosphatase (APISO): 66 U/L (ref 37–153)
BUN/Creatinine Ratio: 16 (calc) (ref 6–22)
BUN: 19 mg/dL (ref 7–25)
CO2: 26 mmol/L (ref 20–32)
Calcium: 9.2 mg/dL (ref 8.6–10.4)
Chloride: 106 mmol/L (ref 98–110)
Creat: 1.16 mg/dL — ABNORMAL HIGH (ref 0.60–1.00)
Globulin: 2.6 g/dL (calc) (ref 1.9–3.7)
Glucose, Bld: 79 mg/dL (ref 65–99)
Potassium: 4.8 mmol/L (ref 3.5–5.3)
Sodium: 141 mmol/L (ref 135–146)
Total Bilirubin: 0.5 mg/dL (ref 0.2–1.2)
Total Protein: 6.6 g/dL (ref 6.1–8.1)
eGFR: 49 mL/min/{1.73_m2} — ABNORMAL LOW (ref >=60)

## 2021-07-08 ENCOUNTER — Encounter: Payer: Self-pay | Admitting: Gastroenterology

## 2021-07-09 ENCOUNTER — Other Ambulatory Visit: Payer: Self-pay | Admitting: Family Medicine

## 2021-07-09 ENCOUNTER — Telehealth: Payer: Self-pay | Admitting: Family Medicine

## 2021-07-09 MED ORDER — CYCLOBENZAPRINE HCL 10 MG PO TABS: 10.0000 mg | ORAL_TABLET | Freq: Three times a day (TID) | ORAL | 0 refills | Status: DC | PRN

## 2021-07-09 NOTE — Telephone Encounter (Signed)
I do not see any prior prescription for a muscle relaxer.   Please advise.

## 2021-07-09 NOTE — Telephone Encounter (Signed)
Patient called to request refill of muscle relaxer; patient unsure of name of medication. Patient has been out of medication for a while.   Pharmacy confirmed as   [facility] [address] [place]), [place] - [address]  [address] [facility] [place] [address]  Phone:  [phone]  Fax:  [phone]   Please advise at [phone]

## 2021-07-10 ENCOUNTER — Telehealth: Payer: Self-pay | Admitting: *Deleted

## 2021-07-10 NOTE — Telephone Encounter (Signed)
Call placed to patient and patient made aware.  

## 2021-07-10 NOTE — Telephone Encounter (Signed)
Received request from pharmacy for PA on Flexeril.  PA submitted.   Dx: J18.841- muscle spasms.  Your information has been submitted to [place] [employer] Part D. [employer] will review the request and will issue a decision, typically within 1-3 days from your submission. You can check the updated outcome later by reopening this request.  If [employer] has not responded in 1-3 days or if you have any questions about your ePA request, please contact [place] Medicare Part D at [phone]

## 2021-07-11 ENCOUNTER — Telehealth: Payer: Self-pay | Admitting: *Deleted

## 2021-07-11 DIAGNOSIS — Z1211 Encounter for screening for malignant neoplasm of colon: Secondary | ICD-10-CM

## 2021-07-11 DIAGNOSIS — Z1212 Encounter for screening for malignant neoplasm of rectum: Secondary | ICD-10-CM

## 2021-07-11 NOTE — Telephone Encounter (Signed)
Patient due for Cologuard re-screen.   Order placed via [employer].  

## 2021-07-11 NOTE — Telephone Encounter (Signed)
Received PA determination.   PA approved 09/29/2020- 10/08/2021.

## 2021-07-19 ENCOUNTER — Emergency Department (HOSPITAL_COMMUNITY): Payer: Medicare HMO

## 2021-07-19 ENCOUNTER — Other Ambulatory Visit: Payer: Self-pay

## 2021-07-19 ENCOUNTER — Emergency Department (HOSPITAL_COMMUNITY)
Admission: EM | Admit: 2021-07-19 | Discharge: 2021-07-19 | Disposition: A | Discharge status: Home / Self Care | Payer: Medicare HMO | Attending: Emergency Medicine | Admitting: Emergency Medicine

## 2021-07-19 DIAGNOSIS — K148 Other diseases of tongue: Secondary | ICD-10-CM | POA: Diagnosis not present

## 2021-07-19 DIAGNOSIS — M419 Scoliosis, unspecified: Secondary | ICD-10-CM | POA: Diagnosis not present

## 2021-07-19 DIAGNOSIS — J029 Acute pharyngitis, unspecified: Secondary | ICD-10-CM | POA: Diagnosis not present

## 2021-07-19 DIAGNOSIS — Z8709 Personal history of other diseases of the respiratory system: Secondary | ICD-10-CM | POA: Diagnosis not present

## 2021-07-19 DIAGNOSIS — M47812 Spondylosis without myelopathy or radiculopathy, cervical region: Secondary | ICD-10-CM | POA: Diagnosis not present

## 2021-07-19 DIAGNOSIS — N183 Chronic kidney disease, stage 3 unspecified: Secondary | ICD-10-CM | POA: Diagnosis not present

## 2021-07-19 DIAGNOSIS — R1310 Dysphagia, unspecified: Secondary | ICD-10-CM | POA: Diagnosis not present

## 2021-07-19 LAB — CBC WITH DIFFERENTIAL/PLATELET
Abs Immature Granulocytes: 0.05 10*3/uL (ref 0.00–0.07)
Basophils Absolute: 0.1 10*3/uL (ref 0.0–0.1)
Basophils Relative: 1 %
Eosinophils Absolute: 0.1 10*3/uL (ref 0.0–0.5)
Eosinophils Relative: 1 %
HCT: 42.1 % (ref 36.0–46.0)
Hemoglobin: 13 g/dL (ref 12.0–15.0)
Immature Granulocytes: 1 %
Lymphocytes Relative: 14 %
Lymphs Abs: 1.6 10*3/uL (ref 0.7–4.0)
MCH: 25.3 pg — ABNORMAL LOW (ref 26.0–34.0)
MCHC: 30.9 g/dL (ref 30.0–36.0)
MCV: 81.9 fL (ref 80.0–100.0)
Monocytes Absolute: 0.7 10*3/uL (ref 0.1–1.0)
Monocytes Relative: 7 %
Neutro Abs: 8.3 10*3/uL — ABNORMAL HIGH (ref 1.7–7.7)
Neutrophils Relative %: 76 %
Platelets: 220 10*3/uL (ref 150–400)
RBC: 5.14 MIL/uL — ABNORMAL HIGH (ref 3.87–5.11)
RDW: 15.2 % (ref 11.5–15.5)
WBC: 10.8 10*3/uL — ABNORMAL HIGH (ref 4.0–10.5)
nRBC: 0 % (ref 0.0–0.2)

## 2021-07-19 LAB — BASIC METABOLIC PANEL
Anion gap: 9 (ref 5–15)
BUN: 14 mg/dL (ref 8–23)
CO2: 23 mmol/L (ref 22–32)
Calcium: 9.2 mg/dL (ref 8.9–10.3)
Chloride: 105 mmol/L (ref 98–111)
Creatinine, Ser: 0.99 mg/dL (ref 0.44–1.00)
GFR, Estimated: 59 mL/min — ABNORMAL LOW (ref >60)
Glucose, Bld: 104 mg/dL — ABNORMAL HIGH (ref 70–99)
Potassium: 4.3 mmol/L (ref 3.5–5.1)
Sodium: 137 mmol/L (ref 135–145)

## 2021-07-19 LAB — TSH: TSH: 1.262 u[IU]/mL (ref 0.350–4.500)

## 2021-07-19 LAB — GROUP A STREP BY PCR: Group A Strep by PCR: NOT DETECTED

## 2021-07-19 MED ORDER — CLINDAMYCIN PHOSPHATE 600 MG/50ML IV SOLN
600.0000 mg | Freq: Once | INTRAVENOUS | Status: AC
  Administered 2021-07-19: 600 mg via INTRAVENOUS
  Filled 2021-07-19: qty 50

## 2021-07-19 MED ORDER — IOHEXOL 300 MG/ML  SOLN
75.0000 mL | Freq: Once | INTRAMUSCULAR | Status: AC | PRN
  Administered 2021-07-19: 75 mL via INTRAVENOUS

## 2021-07-19 MED ORDER — CLINDAMYCIN HCL 150 MG PO CAPS: 600.0000 mg | ORAL_CAPSULE | Freq: Three times a day (TID) | ORAL | 0 refills | Status: AC

## 2021-07-19 MED ORDER — ACETAMINOPHEN 500 MG PO TABS
1000.0000 mg | ORAL_TABLET | Freq: Once | ORAL | Status: AC
  Administered 2021-07-19: 1000 mg via ORAL
  Filled 2021-07-19: qty 2

## 2021-07-19 NOTE — ED Triage Notes (Signed)
Pt c/o sore and swollen throat since yesterday. Pt reports difficulty swallowing.

## 2021-07-19 NOTE — ED Provider Notes (Signed)
[facility] EMERGENCY DEPARTMENT Provider Note   CSN: [id number] Arrival date & time: 07/19/21  0044     History Chief Complaint  Patient presents with   Sore Throat    Amy Powell is a 75 y.o. female with PMHx HLD and CKD who presents to the ED today with complaint of sudden onset, constant, sharp, sore throat/neck pain that began yesterday. Pt reports she noticed a large area of swelling to the midline of her anterior neck yesterday when the pain started. She denies small mass that has grown in size over a couple of months and reports it became enlarged overnight. She denies issues with her thyroid in the past. She is having pain with swallowing. Has been taking OTC medications without much relief. She states she had a fever on arrival to the ED - 99.8. Denies chills. No other complaints at this time.   The history is provided by the patient and medical records.      Past Medical History:  Diagnosis Date   CKD (chronic kidney disease) stage 3, GFR 30-59 ml/min (HCC)    HLD (hyperlipidemia)    Osteoporosis     Patient Active Problem List   Diagnosis Date Noted   Osteoporosis    CKD (chronic kidney disease) stage 3, GFR 30-59 ml/min (HCC)    HLD (hyperlipidemia)    Closed fracture of lower end of left ulna with routine healing 05/03/2018   BPPV (benign paroxysmal positional vertigo) 01/13/2013    Past Surgical History:  Procedure Laterality Date   ABDOMINAL HYSTERECTOMY     for fibroids, TAHBSO     OB History   No obstetric history on file.     Family History  Problem Relation Age of Onset   Hypertension Mother    Stroke Mother    Diabetes Mother    Alcohol abuse Father    Hypertension Father    Heart disease Brother    Hypertension Sister    Diabetes Sister    Breast cancer Neg Hx     Social History   Tobacco Use   Smoking status: Never   Smokeless tobacco: Never  Substance Use Topics   Alcohol use: Yes   Drug use: No     Home Medications Prior to Admission medications   Medication Sig Start Date End Date Taking? Authorizing Provider  clindamycin (CLEOCIN) 150 MG capsule Take 4 capsules (600 mg total) by mouth 3 (three) times daily for 10 days. 07/19/21 07/29/21 Yes Alicea Wente, PA-C  Cholecalciferol (VITAMIN D3) 125 MCG (5000 UT) TBDP Take by mouth.    [provider]  cyclobenzaprine (FLEXERIL) 10 MG tablet Take 1 tablet (10 mg total) by mouth 3 (three) times daily as needed for muscle spasms. 07/09/21   Susy Frizzle, MD    Allergies    Penicillins  Review of Systems   Review of Systems  Constitutional:  Positive for fever. Negative for activity change, chills, diaphoresis and unexpected weight change.  HENT:  Positive for sore throat. Negative for drooling and voice change.   Respiratory:  Negative for cough and shortness of breath.   Cardiovascular:  Negative for chest pain.  Musculoskeletal:  Positive for neck pain.  All other systems reviewed and are negative.  Physical Exam Updated Vital Signs BP 135/75 (BP Location: Left Arm)   Pulse 69   Temp 98.1 F (36.7 C) (Oral)   Resp 17   Ht 5\' 6"  (1.676 m)   Wt 64.4 kg  SpO2 94%   BMI 22.92 kg/m   Physical Exam Vitals and nursing note reviewed.  Constitutional:      Appearance: She is not ill-appearing or diaphoretic.  HENT:     Head: Normocephalic and atraumatic.     Mouth/Throat:     Pharynx: Oropharynx is clear. Uvula midline. No pharyngeal swelling or posterior oropharyngeal erythema.  Eyes:     Conjunctiva/sclera: Conjunctivae normal.  Neck:     Trachea: Trachea normal.      Comments: 3 x 3 cm enlargement overlying the thyroid gland with significant TTP.  No erythema or increased warmth. Mass is not pulsatile.  Cardiovascular:     Rate and Rhythm: Normal rate and regular rhythm.  Pulmonary:     Effort: Pulmonary effort is normal.     Breath sounds: Normal breath sounds.  Skin:    General: Skin is warm  and dry.     Coloration: Skin is not jaundiced.  Neurological:     Mental Status: She is alert.    ED Results / Procedures / Treatments   Labs (all labs ordered are listed, but only abnormal results are displayed) Labs Reviewed  CBC WITH DIFFERENTIAL/PLATELET - Abnormal; Notable for the following components:      Result Value   WBC 10.8 (*)    RBC 5.14 (*)    MCH 25.3 (*)    Neutro Abs 8.3 (*)    All other components within normal limits  BASIC METABOLIC PANEL - Abnormal; Notable for the following components:   Glucose, Bld 104 (*)    GFR, Estimated 59 (*)    All other components within normal limits  GROUP A STREP BY PCR  TSH    EKG None  Radiology CT Soft Tissue Neck W Contrast  Result Date: 07/19/2021 CLINICAL DATA:  Neck mass, initial workup; midline neck mass. Question goiter. Additional history obtained from [place] reports swollen/sore throat since yesterday, difficulty swallowing. EXAM: CT NECK WITH CONTRAST TECHNIQUE: Multidetector CT imaging of the neck was performed using the standard protocol following the bolus administration of intravenous contrast. CONTRAST:  46mL OMNIPAQUE IOHEXOL 300 MG/ML  SOLN COMPARISON:  Brain MRI 07/02/2011. FINDINGS: Pharynx and larynx: The patient is edentulous. No appreciable swelling or discrete mass within the oral cavity, pharynx or larynx. Salivary glands: Two punctate calcifications versus stones within the right parotid tail. The bilateral parotid and submandibular glands are otherwise unremarkable. Thyroid: Subcentimeter thyroid nodules, not meeting consensus criteria for ultrasound follow-up based on size. Lymph nodes: No pathologically enlarged cervical chain lymph nodes. Vascular: The major vascular structures of the neck are patent. Limited intracranial: No evidence of acute intracranial abnormality within the field of view. Visualized orbits: No acute or significant orbital finding. Mastoids and  visualized paranasal sinuses: No significant paranasal sinus disease or mastoid effusion at the imaged levels. Skeleton: Cervical spondylosis. Cervicothoracic scoliosis. No acute bony abnormality or aggressive osseous lesion. Upper chest: No consolidation within the imaged lung apices. Other: Within the midline anterior neck, interposed between the hyoid bone and thyroid cartilage, there is a 2.8 x 3.0 x 2.2 cm centrally low-attenuation lesion with a peripheral enhancing soft tissue rim. Additionally, there is an internal focus of linear hyperdensity/enhancement. Surrounding edema within the strap muscles and ventral neck. IMPRESSION: 2.8 x 3.0 x 2.2 cm centrally low-attenuation lesion with an enhancing soft tissue rim in the midline anterior neck (interposed between the hyoid bone and thyroid cartilage). There is an internal linear focus of hyperdensity/enhancement. Surrounding edema  within the ventral strap muscles and anterior neck. Given the location, somewhat complex appearance and acute presentation, this is favored to reflect an actively infected thyroglossal duct cyst. ENT consultation is recommended. Two punctate calcifications versus stones within the right parotid tail, incidentally noted. Electronically Signed   By: Kellie Simmering D.O.   On: 07/19/2021 08:47    Procedures Procedures   Medications Ordered in ED Medications  acetaminophen (TYLENOL) tablet 1,000 mg (1,000 mg Oral Given 07/19/21 0738)  iohexol (OMNIPAQUE) 300 MG/ML solution 75 mL (75 mLs Intravenous Contrast Given 07/19/21 0813)  clindamycin (CLEOCIN) IVPB 600 mg (0 mg Intravenous Stopped 07/19/21 1118)    ED Course  I have reviewed the triage vital signs and the nursing notes.  Pertinent labs & imaging results that were available during my care of the patient were reviewed by me and considered in my medical decision making (see chart for details).    MDM Rules/Calculators/A&P                           74 year old female  who presents to the ED today with complaint of sore throat, neck pain, enlargement to neck for the past day with pain with swallowing.  On arrival to the ED patient was slightly elevated temperature at 99.8 and tachycardic at 103.  Blood pressure initially elevated 177/86.  Repeat vitals with temp decreasing at 98.9.  No longer tachycardic with heart rate of 80 blood pressure is decreased at 142/97.  Remainder vitals unremarkable.  She had a strep test done while in the waiting room which has returned negative.  On my exam she has no posterior oropharyngeal erythema or edema.  Uvula is midline.  Tolerating secretions without difficulty.  She is noted to have a enlarged mass to the midline of the neck along the thyroid gland measuring about 3 x 3 cm.  Significantly tender to palpation.  We will plan for labs and CT soft tissue neck at this time for further evaluation.  Will provide Tylenol for pain.  We will also add on TSH.  May consider ultrasound of thyroid if no significant findings on CT.  CBC with mild elevated WBC count at 10.8 without left shift. Hgb stable at 13.0 BMP without electrolyte abnormalities  CT: IMPRESSION:  2.8 x 3.0 x 2.2 cm centrally low-attenuation lesion with an  enhancing soft tissue rim in the midline anterior neck (interposed  between the hyoid bone and thyroid cartilage). There is an internal  linear focus of hyperdensity/enhancement. Surrounding edema within  the ventral strap muscles and anterior neck. Given the location,  somewhat complex appearance and acute presentation, this is favored  to reflect an actively infected thyroglossal duct cyst. ENT  consultation is recommended.     Two punctate calcifications versus stones within the right parotid  tail, incidentally noted.   Will provide IV clindamycin at this time and consult ENT  Discussed case with Dr. Redmond Baseman ENT; recommends outpatient follow up and Rx clindamycin. Pt in agreement with plan. Stable for discharge  at this time.   This note was prepared using Dragon voice recognition software and may include unintentional dictation errors due to the inherent limitations of voice recognition software.  Final Clinical Impression(s) / ED Diagnoses Final diagnoses:  Thyroglossal duct infection    Rx / DC Orders ED Discharge Orders          Ordered    clindamycin (CLEOCIN) 150 MG capsule  3 times daily  07/19/21 1119             Discharge Instructions      Please follow up with Dr. Redmond [facility] next week for further evaluation of your infection. Call to schedule an appointment.   Pick up your antibiotics and take as prescribed. You can also take Ibuprofen and Tylenol as needed for pain/fever.   Return to the ED IMMEDIATELY for any new/worsening symptoms including worsening swelling, difficulty breathing, drooling on yourself/inability to swallow, loud whistling noise in throat while breathing, or any other new/concerning symptoms.        Eustaquio Maize, PA-C 07/19/21 1121    Tegeler, Gwenyth Allegra, MD 07/20/21 [phone]

## 2021-07-19 NOTE — Discharge Instructions (Addendum)
Please follow up with Dr. Redmond Baseman [facility] next week for further evaluation of your infection. Call to schedule an appointment.   Pick up your antibiotics and take as prescribed. You can also take Ibuprofen and Tylenol as needed for pain/fever.   Return to the ED IMMEDIATELY for any new/worsening symptoms including worsening swelling, difficulty breathing, drooling on yourself/inability to swallow, loud whistling noise in throat while breathing, or any other new/concerning symptoms.

## 2021-08-06 ENCOUNTER — Encounter: Payer: Self-pay | Admitting: Gastroenterology

## 2021-08-06 ENCOUNTER — Telehealth: Payer: Self-pay

## 2021-08-06 ENCOUNTER — Encounter: Payer: Self-pay | Admitting: *Deleted

## 2021-08-06 ENCOUNTER — Ambulatory Visit (INDEPENDENT_AMBULATORY_CARE_PROVIDER_SITE_OTHER): Payer: Medicare HMO | Admitting: Gastroenterology

## 2021-08-06 VITALS — BP 130/74 | HR 88 | Ht 66.0 in | Wt 140.1 lb

## 2021-08-06 DIAGNOSIS — R195 Other fecal abnormalities: Secondary | ICD-10-CM

## 2021-08-06 MED ORDER — SUTAB 1479-225-188 MG PO TABS: 1.0000 | ORAL_TABLET | Freq: Once | ORAL | 0 refills | Status: AC

## 2021-08-06 NOTE — Patient Instructions (Signed)
If you are age 75 or older, your body mass index should be between 23-30. Your Body mass index is 22.62 kg/m. If this is out of the aforementioned range listed, please consider follow up with your Primary Care Provider.  If you are age 61 or younger, your body mass index should be between 19-25. Your Body mass index is 22.62 kg/m. If this is out of the aformentioned range listed, please consider follow up with your Primary Care Provider.  You have been scheduled for a colonoscopy. Please follow written instructions given to you at your visit today.  Please pick up your prep supplies at the pharmacy within the next 1-3 days. If you use inhalers (even only as needed), please bring them with you on the day of your procedure.   The [facility] providers would like to encourage you to use [facility] to communicate with providers for non-urgent requests or questions.  Due to long hold times on the telephone, sending your provider a message by [facility] may be a faster and more efficient way to get a response.  Please allow 48 business hours for a response.  Please remember that this is for non-urgent requests.   Due to recent changes in healthcare laws, you may see the results of your imaging and laboratory studies on MyChart before your provider has had a chance to review them.  We understand that in some cases there may be results that are confusing or concerning to you. Not all laboratory results come back in the same time frame and the provider may be waiting for multiple results in order to interpret others.  Please give [place] 48 hours in order for your provider to thoroughly review all the results before contacting the office for clarification of your results.   It was a pleasure to see you today!  Thank you for trusting me with your gastrointestinal care!    Scott E.Candis Schatz, MD

## 2021-08-06 NOTE — Telephone Encounter (Signed)
Patient seen in the ED on 10-21 for Thyroglossal duct infection. Was seen inpatient by Dr. Redmond Baseman with [facility] and was to follow with him. She was seen in our office today by Dr. Candis Schatz and was scheduled her for a colonoscopy on 11-29 for positive Cologuard. Dr. Candis Schatz indicates that the [facility] would like her to have the ENT follow up prior to her procedure. Called Dr. Redmond Baseman office and scheduled the patient for an appointment on Nov 15th at 2:10pm  Address: [address]. Patient to bring ID and insurance card.  Called patient to advise of appointment but she indicated that she can not make that appointment. Patient is instructed to call Dr. Redmond Baseman' office to reschedule that appointment and was advised to call us back to let us know when she is schedule and that if she can't get in before 11-28 we will need to reschedule her colonoscopy.

## 2021-08-06 NOTE — Progress Notes (Signed)
HPI :   Amy Powell is a very pleasant 75 year old female with a history of osteoporosis who is referred to [place] by Dr. Jenna Luo for a positive FIT test.  This test was apparently performed by her insurance company at home.  I do not have access to these results.  She had a negative Cologuard in 2019.  She has never had a colonoscopy.  The patient denies any history of seeing blood in her stool.  She denies any other chronic GI symptoms such as abdominal pain, constipation or diarrhea. She states that she had been having some constipation recently, but she was recently prescribed antibiotics and the constipation resolved.  Currently having regular formed brown bowel movements daily. She was recently seen in the ED for difficult, painful swallowing.  A CT showed findings suggestive of a thyroglossal duct cyst.  She was prescribed Clindamycin and her swallowing difficulties resolved.  She has not yet made an appointment with ENT. She denies any history of chronic GERD symptoms, or any chronic dysphagia symptoms prior to the acute onset of dysphagia prior to her ED visit. She has no known cardiopulmonary comorbidities and denies any symptoms of chest pain/pressure, dyspnea, palpitation, LH/dizziness.  She lives alone and manages her household independently.   Past Medical History:  Diagnosis Date   CKD (chronic kidney disease) stage 3, GFR 30-59 ml/min (HCC)    HLD (hyperlipidemia)    Osteoporosis      Past Surgical History:  Procedure Laterality Date   ABDOMINAL HYSTERECTOMY     for fibroids, TAHBSO   Family History  Problem Relation Age of Onset   Hypertension Mother    Stroke Mother    Diabetes Mother    Alcohol abuse Father    Hypertension Father    Kidney disease Sister    Heart disease Sister    Hypertension Sister    Diabetes Sister    Lung cancer Sister    Heart disease Brother    Heart disease Brother    Breast cancer Neg Hx    Colon cancer Neg Hx    Esophageal  cancer Neg Hx    Pancreatic cancer Neg Hx    Stomach cancer Neg Hx    Social History   Tobacco Use   Smoking status: Never   Smokeless tobacco: Never  Vaping Use   Vaping Use: Never used  Substance Use Topics   Alcohol use: Yes    Comment: rarely   Drug use: No   Current Outpatient Medications  Medication Sig Dispense Refill   Cholecalciferol (VITAMIN D3) 125 MCG (5000 UT) TBDP Take 1 tablet by mouth daily at 2 PM.     No current facility-administered medications for this visit.   Allergies  Allergen Reactions   Penicillins Itching    Has patient had a PCN reaction causing immediate rash, facial/tongue/throat swelling, SOB or lightheadedness with hypotension: Y Has patient had a PCN reaction causing severe rash involving mucus membranes or skin necrosis: Y Has patient had a PCN reaction that required hospitalization: N Has patient had a PCN reaction occurring within the last 10 years: N If all of the above answers are "NO", then may proceed with Cephalosporin use.      Review of Systems: All systems reviewed and negative except where noted in HPI.    CT Soft Tissue Neck W Contrast  Result Date: 07/19/2021 CLINICAL DATA:  Neck mass, initial workup; midline neck mass. Question goiter. Additional history obtained from electronic medical  record: Patient reports swollen/sore throat since yesterday, difficulty swallowing. EXAM: CT NECK WITH CONTRAST TECHNIQUE: Multidetector CT imaging of the neck was performed using the standard protocol following the bolus administration of intravenous contrast. CONTRAST:  14mL OMNIPAQUE IOHEXOL 300 MG/ML  SOLN COMPARISON:  Brain MRI 07/02/2011. FINDINGS: Pharynx and larynx: The patient is edentulous. No appreciable swelling or discrete mass within the oral cavity, pharynx or larynx. Salivary glands: Two punctate calcifications versus stones within the right parotid tail. The bilateral parotid and submandibular glands are otherwise unremarkable.  Thyroid: Subcentimeter thyroid nodules, not meeting consensus criteria for ultrasound follow-up based on size. Lymph nodes: No pathologically enlarged cervical chain lymph nodes. Vascular: The major vascular structures of the neck are patent. Limited intracranial: No evidence of acute intracranial abnormality within the field of view. Visualized orbits: No acute or significant orbital finding. Mastoids and visualized paranasal sinuses: No significant paranasal sinus disease or mastoid effusion at the imaged levels. Skeleton: Cervical spondylosis. Cervicothoracic scoliosis. No acute bony abnormality or aggressive osseous lesion. Upper chest: No consolidation within the imaged lung apices. Other: Within the midline anterior neck, interposed between the hyoid bone and thyroid cartilage, there is a 2.8 x 3.0 x 2.2 cm centrally low-attenuation lesion with a peripheral enhancing soft tissue rim. Additionally, there is an internal focus of linear hyperdensity/enhancement. Surrounding edema within the strap muscles and ventral neck. IMPRESSION: 2.8 x 3.0 x 2.2 cm centrally low-attenuation lesion with an enhancing soft tissue rim in the midline anterior neck (interposed between the hyoid bone and thyroid cartilage). There is an internal linear focus of hyperdensity/enhancement. Surrounding edema within the ventral strap muscles and anterior neck. Given the location, somewhat complex appearance and acute presentation, this is favored to reflect an actively infected thyroglossal duct cyst. ENT consultation is recommended. Two punctate calcifications versus stones within the right parotid tail, incidentally noted. Electronically Signed   By: Kellie Simmering D.O.   On: 07/19/2021 08:47    Physical Exam: BP 130/74   Pulse 88   Ht 5\' 6"  (1.676 m)   Wt 140 lb 2 oz (63.6 kg)   SpO2 100%   BMI 22.62 kg/m  Constitutional: Pleasant,well-developed, African American female in no acute distress. HEENT: Normocephalic and  atraumatic. Conjunctivae are normal. No scleral icterus. Neck supple, tender anterior protuberance/soft tissue swelling, soft.  Cardiovascular: Normal rate, regular rhythm.  Pulmonary/chest: Effort normal and breath sounds normal. No wheezing, rales or rhonchi. Abdominal: Soft, nondistended, nontender. Bowel sounds active throughout. There are no masses palpable. No hepatomegaly. Extremities: no edema Lymphadenopathy: No cervical adenopathy noted. Neurological: Alert and oriented to person place and time. Skin: Skin is warm and dry. No rashes noted. Psychiatric: Normal mood and affect. Behavior is normal.  CBC    Component Value Date/Time   WBC 10.8 (H) 07/19/2021 0747   RBC 5.14 (H) 07/19/2021 0747   HGB 13.0 07/19/2021 0747   HCT 42.1 07/19/2021 0747   PLT 220 07/19/2021 0747   MCV 81.9 07/19/2021 0747   MCH 25.3 (L) 07/19/2021 0747   MCHC 30.9 07/19/2021 0747   RDW 15.2 07/19/2021 0747   LYMPHSABS 1.6 07/19/2021 0747   MONOABS 0.7 07/19/2021 0747   EOSABS 0.1 07/19/2021 0747   BASOSABS 0.1 07/19/2021 0747    CMP     Component Value Date/Time   NA 137 07/19/2021 0747   K 4.3 07/19/2021 0747   CL 105 07/19/2021 0747   CO2 23 07/19/2021 0747   GLUCOSE 104 (H) 07/19/2021 0747   BUN 14  07/19/2021 0747   CREATININE 0.99 07/19/2021 0747   CREATININE 1.16 (H) 07/02/2021 1445   CALCIUM 9.2 07/19/2021 0747   PROT 6.6 07/02/2021 1445   ALBUMIN 3.6 02/16/2015 0827   AST 13 07/02/2021 1445   ALT 6 07/02/2021 1445   ALKPHOS 69 02/16/2015 0827   BILITOT 0.5 07/02/2021 1445   GFRNONAA 59 (L) 07/19/2021 0747   GFRNONAA 51 (L) 10/24/2019 1237   GFRAA 59 (L) 10/24/2019 1237     ASSESSMENT AND PLAN: 75 year old female with no prior colonoscopy, found to have a positive FIT test performed by her insurance company (results not available for my review).  No overt GI bleeding.  The patient is a very healthy and fit 75 year old female with no significant cardiopulmonary comorbidities.   She does have a suspected thyroglossal duct cyst, but I don't think this would preclude an elective sedated procedure.  Will schedule her for a colonoscopy in the [facility] and notify our lead CRNA to ensure there are no concerns from an anesthesia perspective.   Positive FIT - Colonoscopy  Thyroglossal duct cyst, symptoms improved with antibiotics, but still with TTP - Pt to f/u with ENT  The details, risks (including bleeding, perforation, infection, missed lesions, medication reactions and possible hospitalization or surgery if complications occur), benefits, and alternatives to colonoscopy with possible biopsy and possible polypectomy were discussed with the patient and she consents to proceed.   Tobey Lippard E. Candis Schatz, MD [facility]   CC: Susy Frizzle, MD

## 2021-08-08 DIAGNOSIS — Q892 Congenital malformations of other endocrine glands: Secondary | ICD-10-CM | POA: Diagnosis not present

## 2021-08-08 NOTE — Telephone Encounter (Signed)
Inbound call from patient states she saw her ENT 11/10. OV notes are in EPIC

## 2021-08-26 ENCOUNTER — Encounter: Payer: Self-pay | Admitting: Certified Registered Nurse Anesthetist

## 2021-08-27 ENCOUNTER — Encounter: Payer: Self-pay | Admitting: Gastroenterology

## 2021-08-27 ENCOUNTER — Other Ambulatory Visit: Payer: Self-pay | Admitting: Gastroenterology

## 2021-08-27 ENCOUNTER — Other Ambulatory Visit: Payer: Self-pay

## 2021-08-27 ENCOUNTER — Other Ambulatory Visit: Payer: Medicare HMO

## 2021-08-27 ENCOUNTER — Other Ambulatory Visit: Payer: Self-pay | Admitting: General Surgery

## 2021-08-27 ENCOUNTER — Ambulatory Visit (AMBULATORY_SURGERY_CENTER): Payer: Medicare HMO | Admitting: Gastroenterology

## 2021-08-27 VITALS — BP 147/78 | HR 85 | Temp 97.1°F | Resp 14 | Ht 66.0 in | Wt 140.0 lb

## 2021-08-27 DIAGNOSIS — D126 Benign neoplasm of colon, unspecified: Secondary | ICD-10-CM

## 2021-08-27 DIAGNOSIS — K6389 Other specified diseases of intestine: Secondary | ICD-10-CM | POA: Diagnosis not present

## 2021-08-27 DIAGNOSIS — K5730 Diverticulosis of large intestine without perforation or abscess without bleeding: Secondary | ICD-10-CM

## 2021-08-27 DIAGNOSIS — D128 Benign neoplasm of rectum: Secondary | ICD-10-CM

## 2021-08-27 DIAGNOSIS — C189 Malignant neoplasm of colon, unspecified: Secondary | ICD-10-CM | POA: Diagnosis not present

## 2021-08-27 DIAGNOSIS — E785 Hyperlipidemia, unspecified: Secondary | ICD-10-CM | POA: Diagnosis not present

## 2021-08-27 DIAGNOSIS — C180 Malignant neoplasm of cecum: Secondary | ICD-10-CM

## 2021-08-27 DIAGNOSIS — K635 Polyp of colon: Secondary | ICD-10-CM | POA: Diagnosis not present

## 2021-08-27 DIAGNOSIS — D122 Benign neoplasm of ascending colon: Secondary | ICD-10-CM | POA: Diagnosis not present

## 2021-08-27 DIAGNOSIS — D123 Benign neoplasm of transverse colon: Secondary | ICD-10-CM

## 2021-08-27 DIAGNOSIS — R195 Other fecal abnormalities: Secondary | ICD-10-CM | POA: Diagnosis not present

## 2021-08-27 DIAGNOSIS — R970 Elevated carcinoembryonic antigen [CEA]: Secondary | ICD-10-CM | POA: Diagnosis not present

## 2021-08-27 DIAGNOSIS — N183 Chronic kidney disease, stage 3 unspecified: Secondary | ICD-10-CM | POA: Diagnosis not present

## 2021-08-27 MED ORDER — SODIUM CHLORIDE 0.9 % IV SOLN
500.0000 mL | Freq: Once | INTRAVENOUS | Status: DC
Start: 2021-08-27 — End: 2021-08-27

## 2021-08-27 NOTE — Progress Notes (Signed)
History and Physical Interval Note:  08/27/2021 7:58 AM  Amy Powell  has presented today for endoscopic procedure(s), with the diagnosis of  Encounter Diagnosis  Name Primary?   Positive FIT (fecal immunochemical test) Yes  .  The various methods of evaluation and treatment have been discussed with the patient and/or family. After consideration of risks, benefits and other options for treatment, the patient has consented to  the endoscopic procedure(s).   The patient's history has been reviewed, patient examined, no change in status, stable for endoscopic procedure(s).  I have reviewed the patient's chart and labs.  Questions were answered to the patient's satisfaction.    No changes to the patient's medical history or symptoms since her clinic visit.  Jaquavian Firkus E. Candis Schatz, MD [facility]

## 2021-08-27 NOTE — Progress Notes (Signed)
Report given to PACU, vss 

## 2021-08-27 NOTE — Op Note (Signed)
[facility] Patient Name: Amy Powell Procedure Date: 08/27/2021 7:20 AM MRN: [id number] Endoscopist: Nicki Reaper E. Candis Schatz , MD Age: 75 Referring MD:  Date of Birth: 1946/04/17 Gender: Female Account #: [id number] Procedure:                Colonoscopy Indications:              Positive fecal immunochemical test. This is the                            patient's first colonoscopy Medicines:                Monitored Anesthesia Care Procedure:                Pre-Anesthesia Assessment:                           - Prior to the procedure, a History and Physical                            was performed, and patient medications and                            allergies were reviewed. The patient's tolerance of                            previous anesthesia was also reviewed. The risks                            and benefits of the procedure and the sedation                            options and risks were discussed with the patient.                            All questions were answered, and informed consent                            was obtained. Prior Anticoagulants: The patient has                            taken no previous anticoagulant or antiplatelet                            agents. ASA Grade Assessment: II - A patient with                            mild systemic disease. After reviewing the risks                            and benefits, the patient was deemed in                            satisfactory condition to undergo the procedure.  After obtaining informed consent, the colonoscope                            was passed under direct vision. Throughout the                            procedure, the patient's blood pressure, pulse, and                            oxygen saturations were monitored continuously. The                            Olympus PCF-[id number]) Colonoscope was                            introduced through the anus and  advanced to the the                            cecum, identified by appendiceal orifice and                            ileocecal valve. The colonoscopy was performed                            without difficulty. The patient tolerated the                            procedure well. The quality of the bowel                            preparation was good. The ileocecal valve,                            appendiceal orifice, and rectum were photographed.                            The bowel preparation used was SUPREP via split                            dose instruction. Scope In: 8:04:59 AM Scope Out: 3:79:02 AM Scope Withdrawal Time: 0 hours 38 minutes 39 seconds  Total Procedure Duration: 0 hours 44 minutes 13 seconds  Findings:                 The perianal and digital rectal examinations were                            normal. Pertinent negatives include normal                            sphincter tone and no palpable rectal lesions.                           Two sessile, non-bleeding polyps were found in the  cecum. The polyps were 5 to 8 mm in size.                           A large ulcerated non-obstructing large mass was                            found in the cecum. The mass was partially                            circumferential (involving at least two-thirds of                            the lumen circumference) closely approximating the                            ICV. The terminal ileum was not intubated. No                            bleeding was present. A total of 12 biopsies were                            taken with a cold forceps for histology. Estimated                            blood loss was minimal.                           A 25 mm polyp was found in the proximal ascending                            colon, opposite the ileocecal valve. The polyp was                            sessile.                           Six sessile polyps were found  in the ascending                            colon. The polyps were 3 to 6 mm in size. These                            polyps were removed with a cold snare. Resection                            and retrieval were complete. Estimated blood loss                            was minimal.                           Three sessile polyps were found in the hepatic  flexure. The polyps were 3 to 5 mm in size. These                            polyps were removed with a cold snare. Resection                            and retrieval were complete. Estimated blood loss                            was minimal.                           Six sessile polyps were found in the transverse                            colon. The polyps were 3 to 7 mm in size. These                            polyps were removed with a cold snare. Resection                            and retrieval were complete. Estimated blood loss                            was minimal.                           Two sessile polyps were found in the splenic                            flexure. The polyps were 4 to 8 mm in size. These                            polyps were removed with a cold snare. Resection                            and retrieval were complete. Estimated blood loss                            was minimal.                           A 3 mm polyp was found in the rectum. The polyp was                            sessile. The polyp was removed with a cold snare.                            Resection and retrieval were complete. Estimated                            blood loss was minimal.  Multiple small and large-mouthed diverticula were                            found in the sigmoid colon, descending colon and                            ascending colon. There was no evidence of                            diverticular bleeding.                           The exam was otherwise normal  throughout the                            examined colon.                           The retroflexed view of the distal rectum and anal                            verge was normal and showed no anal or rectal                            abnormalities. Complications:            No immediate complications. Estimated Blood Loss:     Estimated blood loss was minimal. Impression:               - Two 5 to 8 mm, non-bleeding polyps in the cecum.                            These polyps were not removed given their close                            proximity to the mass which will most likely be                            surgically removed                           - Likely malignant tumor in the cecum. Biopsied.                           - One 25 mm polyp in the proximal ascending colon.                            This polyp was not removed given its size and close                            proximity to the mass which will most likely be                            surgically removed.                           -  Six 3 to 6 mm polyps in the ascending colon,                            removed with a cold snare. Resected and retrieved.                           - Three 3 to 5 mm polyps at the hepatic flexure,                            removed with a cold snare. Resected and retrieved.                           - Six 3 to 7 mm polyps in the transverse colon,                            removed with a cold snare. Resected and retrieved.                           - Two 4 to 8 mm polyps at the splenic flexure,                            removed with a cold snare. Resected and retrieved.                           - One 3 mm polyp in the rectum, removed with a cold                            snare. Resected and retrieved.                           - Moderate diverticulosis in the sigmoid colon, in                            the descending colon and in the ascending colon.                            There  was no evidence of diverticular bleeding.                           - The distal rectum and anal verge are normal on                            retroflexion view. Recommendation:           - Patient has a contact number available for                            emergencies. The signs and symptoms of potential                            delayed complications were discussed with the  patient. Return to normal activities tomorrow.                            Written discharge instructions were provided to the                            patient.                           - Resume previous diet.                           - Continue present medications.                           - Await pathology results.                           - Perform a CT scan (computed tomography) of chest                            with contrast, abdomen with contrast and pelvis                            with contrast at appointment to be scheduled.                           - Will place referral to colorectal surgeon to                            discuss surgery.                           - Obtain baseline CEA level today. Ronrico Dupin E. Candis Schatz, MD 08/27/2021 9:07:22 AM This report has been signed electronically.

## 2021-08-27 NOTE — Patient Instructions (Addendum)
Handout on polyps and diverticulosis given to patient. Await pathology results. Perform a CT scan of chest with contrast, abdomen with contrast, and pelvis with contrast at appointment to be scheduled. (Will send contrast home with you today) A referral to colorectal surgeon to discuss surgery will be placed. Obtain baseline CEA level today - will go to basement to lab to collect this on your way out today. Resume previous diet.     YOU HAD AN ENDOSCOPIC PROCEDURE TODAY AT [facility]:   Refer to the procedure report that was given to you for any specific questions about what was found during the examination.  If the procedure report does not answer your questions, please call your gastroenterologist to clarify.  If you requested that your care partner not be given the details of your procedure findings, then the procedure report has been included in a sealed envelope for you to review at your convenience later.  YOU SHOULD EXPECT: Some feelings of bloating in the abdomen. Passage of more gas than usual.  Walking can help get rid of the air that was put into your GI tract during the procedure and reduce the bloating. If you had a lower endoscopy (such as a colonoscopy or flexible sigmoidoscopy) you may notice spotting of blood in your stool or on the toilet paper. If you underwent a bowel prep for your procedure, you may not have a normal bowel movement for a few days.  Please Note:  You might notice some irritation and congestion in your nose or some drainage.  This is from the oxygen used during your procedure.  There is no need for concern and it should clear up in a day or so.  SYMPTOMS TO REPORT IMMEDIATELY:  Following lower endoscopy (colonoscopy or flexible sigmoidoscopy):  Excessive amounts of blood in the stool  Significant tenderness or worsening of abdominal pains  Swelling of the abdomen that is new, acute  Fever of 100F or higher  For urgent or emergent issues,  a gastroenterologist can be reached at any hour by calling [phone]. Do not use MyChart messaging for urgent concerns.    DIET:  We do recommend a small meal at first, but then you may proceed to your regular diet.  Drink plenty of fluids but you should avoid alcoholic beverages for 24 hours.  ACTIVITY:  You should plan to take it easy for the rest of today and you should NOT DRIVE or use heavy machinery until tomorrow (because of the sedation medicines used during the test).    FOLLOW UP: Our staff will call the number listed on your records 48-72 hours following your procedure to check on you and address any questions or concerns that you may have regarding the information given to you following your procedure. If we do not reach you, we will leave a message.  We will attempt to reach you two times.  During this call, we will ask if you have developed any symptoms of COVID 19. If you develop any symptoms (ie: fever, flu-like symptoms, shortness of breath, cough etc.) before then, please call [phone].  If you test positive for Covid 19 in the 2 weeks post procedure, please call and report this information to [place].    If any biopsies were taken you will be contacted by phone or by letter within the next 1-3 weeks.  Please call us at [phone] if you have not heard about the biopsies in 3 weeks.    SIGNATURES/CONFIDENTIALITY:  You and/or your care partner have signed paperwork which will be entered into your electronic medical record.  These signatures attest to the fact that that the information above on your After Visit Summary has been reviewed and is understood.  Full responsibility of the confidentiality of this discharge information lies with you and/or your care-partner.

## 2021-08-28 ENCOUNTER — Telehealth: Payer: Self-pay

## 2021-08-28 ENCOUNTER — Other Ambulatory Visit: Payer: Self-pay

## 2021-08-28 DIAGNOSIS — K6389 Other specified diseases of intestine: Secondary | ICD-10-CM

## 2021-08-28 DIAGNOSIS — C189 Malignant neoplasm of colon, unspecified: Secondary | ICD-10-CM

## 2021-08-28 LAB — CEA: CEA: 1.7 ng/mL

## 2021-08-28 NOTE — Progress Notes (Signed)
Spoke with patient today and informed her that the biopsies confirmed the diagnosis of colon cancer.  The orders for her CT and colorectal surgery consultation have been placed by RN Rosanne Sack and she should expect to hear from radiology and [facility] soon about appointments for the CT and surgical consultation visit.

## 2021-08-28 NOTE — Telephone Encounter (Signed)
Order placed for CT of CAP, radiology scheduling will contact pt regarding the appt. Referral faxed to [facility] for appt with Dr. Dema Severin for colon cancer. [facility] will contact the pt with appt.

## 2021-08-29 ENCOUNTER — Telehealth: Payer: Self-pay | Admitting: *Deleted

## 2021-08-29 NOTE — Telephone Encounter (Signed)
  Follow up Call-  Call back number 08/27/2021  Post procedure Call Back phone  # [phone]  Permission to leave phone message Yes  Some recent data might be hidden     Patient questions:  Do you have a fever, pain , or abdominal swelling? No. Pain Score  0 *  Have you tolerated food without any problems? Yes.    Have you been able to return to your normal activities? Yes.    Do you have any questions about your discharge instructions: Diet   No. Medications  No. Follow up visit  No.  Do you have questions or concerns about your Care? No.  Actions: * If pain score is 4 or above: No action needed, pain <4.  Have you developed a fever since your procedure? no  2.   Have you had an respiratory symptoms (SOB or cough) since your procedure? no  3.   Have you tested positive for COVID 19 since your procedure no  4.   Have you had any family no nomembers/close contacts diagnosed with the COVID 19 since your procedure?     If yes to any of these questions please route to Joylene John, RN and Joella Prince, RN

## 2021-09-04 ENCOUNTER — Telehealth: Payer: Self-pay | Admitting: Gastroenterology

## 2021-09-04 NOTE — Telephone Encounter (Signed)
Pt called and wanted her sons to hear the results of the colonoscopy and to understand what is happening next. Explained the result and that CT scan is planned and that she has been referred to [facility] and should get a call from their office.

## 2021-09-04 NOTE — Telephone Encounter (Signed)
Patient calling to follow up on results, please advise.  [phone]

## 2021-09-18 ENCOUNTER — Ambulatory Visit (HOSPITAL_BASED_OUTPATIENT_CLINIC_OR_DEPARTMENT_OTHER)
Admission: RE | Admit: 2021-09-18 | Discharge: 2021-09-18 | Disposition: A | Discharge status: Home / Self Care | Payer: Medicare HMO | Source: Ambulatory Visit | Attending: Gastroenterology | Admitting: Gastroenterology

## 2021-09-18 ENCOUNTER — Other Ambulatory Visit: Payer: Self-pay

## 2021-09-18 DIAGNOSIS — K6389 Other specified diseases of intestine: Secondary | ICD-10-CM | POA: Diagnosis not present

## 2021-09-18 DIAGNOSIS — I700 Atherosclerosis of aorta: Secondary | ICD-10-CM | POA: Diagnosis not present

## 2021-09-18 DIAGNOSIS — C189 Malignant neoplasm of colon, unspecified: Secondary | ICD-10-CM | POA: Insufficient documentation

## 2021-09-18 LAB — POCT I-STAT CREATININE: Creatinine, Ser: 1.3 mg/dL — ABNORMAL HIGH (ref 0.44–1.00)

## 2021-09-18 MED ORDER — IOHEXOL 300 MG/ML  SOLN
85.0000 mL | Freq: Once | INTRAMUSCULAR | Status: AC | PRN
  Administered 2021-09-18: 09:00:00 75 mL via INTRAVENOUS

## 2021-09-19 ENCOUNTER — Encounter: Payer: Self-pay | Admitting: Gastroenterology

## 2021-09-20 ENCOUNTER — Telehealth: Payer: Self-pay | Admitting: Gastroenterology

## 2021-09-20 NOTE — Telephone Encounter (Signed)
Spoke with pt and reviewed with her that Dr. Candis Schatz spoke with her on 11/30 and let her know the diagnosis of colon cancer. Let her know Dr. Candis Schatz has not reviewed the CT scan yet. Pt has an appt with Dr. Dema Severin on 10/04/21. She wants to know if she has to have surgery or if things can be treated another way. She knows Dr. Loletha Grayer is out of the office and she will be called next week.

## 2021-09-20 NOTE — Telephone Encounter (Signed)
Patient called.  She said another doctor (she could not remember his name) wanted to do an operation on her cancer.  She wanted to know Dr. Dayle Points thoughts:   Does she really have cancer?  Does she need the operation?  Can it be treated any other way?  I explained she would probably receive a call back after Christmas.  She voiced understanding.  Please call and advise.  Thank you.

## 2021-09-25 NOTE — Telephone Encounter (Signed)
[name], MD  Amy Huxley, RN Caller: Unspecified (5 days ago,  3:55 PM) Spoke with patient today and reviewed the results of the CT scan.  Reinforced that she does indeed have colon cancer.  Deferred questions about surgery to Dr. Dema Severin, who she will see on Jan 6th

## 2021-10-04 DIAGNOSIS — C182 Malignant neoplasm of ascending colon: Secondary | ICD-10-CM | POA: Diagnosis not present

## 2021-10-05 DIAGNOSIS — R030 Elevated blood-pressure reading, without diagnosis of hypertension: Secondary | ICD-10-CM | POA: Diagnosis not present

## 2021-10-05 DIAGNOSIS — K5900 Constipation, unspecified: Secondary | ICD-10-CM | POA: Diagnosis not present

## 2021-10-05 DIAGNOSIS — C189 Malignant neoplasm of colon, unspecified: Secondary | ICD-10-CM | POA: Diagnosis not present

## 2021-10-05 DIAGNOSIS — Z880 Allergy status to penicillin: Secondary | ICD-10-CM | POA: Diagnosis not present

## 2021-10-05 DIAGNOSIS — Z809 Family history of malignant neoplasm, unspecified: Secondary | ICD-10-CM | POA: Diagnosis not present

## 2021-10-21 ENCOUNTER — Encounter: Payer: Self-pay | Admitting: Family Medicine

## 2021-10-29 ENCOUNTER — Other Ambulatory Visit: Payer: Self-pay | Admitting: Family Medicine

## 2021-10-29 DIAGNOSIS — Z1231 Encounter for screening mammogram for malignant neoplasm of breast: Secondary | ICD-10-CM

## 2021-10-31 NOTE — Progress Notes (Signed)
Sent message, via epic in basket, requesting orders in epic from surgeon.  

## 2021-10-31 NOTE — Progress Notes (Addendum)
Anesthesia Review:  PCP: DR Dennard Schaumann LOV 07/02/21  Cardiologist : none  Chest x-ray : CT cheest- 09/18/21  EKG : Echo : Stress test: Cardiac Cath :  Activity level: can do a flight of stairs without difficulty  Sleep Study/ CPAP : none  Fasting Blood Sugar :      / Checks Blood Sugar -- times a day:   Blood Thinner/ Instructions /Last Dose: ASA / Instructions/ Last Dose :   Requested orders on 10/31/21 and 11/01/21.   Covid test on 11/18/21. 0800am  [facility] DONE 11/04/21 routed to DR Nadeen Landau.  PT reports at preop that she has preop bowel prep instructions from MD office.

## 2021-11-04 ENCOUNTER — Ambulatory Visit: Payer: Self-pay | Admitting: Surgery

## 2021-11-04 ENCOUNTER — Encounter (HOSPITAL_COMMUNITY): Payer: Self-pay

## 2021-11-04 ENCOUNTER — Encounter (HOSPITAL_COMMUNITY)
Admission: RE | Admit: 2021-11-04 | Discharge: 2021-11-04 | Disposition: A | Discharge status: Home / Self Care | Payer: Medicare HMO | Source: Ambulatory Visit | Attending: Surgery | Admitting: Surgery

## 2021-11-04 ENCOUNTER — Other Ambulatory Visit: Payer: Self-pay | Admitting: Urology

## 2021-11-04 ENCOUNTER — Other Ambulatory Visit: Payer: Self-pay

## 2021-11-04 VITALS — BP 138/79 | HR 59 | Temp 98.4°F | Resp 16 | Ht 66.0 in | Wt 144.0 lb

## 2021-11-04 DIAGNOSIS — Z01812 Encounter for preprocedural laboratory examination: Secondary | ICD-10-CM | POA: Insufficient documentation

## 2021-11-04 DIAGNOSIS — Z01818 Encounter for other preprocedural examination: Secondary | ICD-10-CM

## 2021-11-04 LAB — CBC WITH DIFFERENTIAL/PLATELET
Abs Immature Granulocytes: 0.02 10*3/uL (ref 0.00–0.07)
Basophils Absolute: 0 10*3/uL (ref 0.0–0.1)
Basophils Relative: 1 %
Eosinophils Absolute: 0.2 10*3/uL (ref 0.0–0.5)
Eosinophils Relative: 2 %
HCT: 34.8 % — ABNORMAL LOW (ref 36.0–46.0)
Hemoglobin: 10.3 g/dL — ABNORMAL LOW (ref 12.0–15.0)
Immature Granulocytes: 0 %
Lymphocytes Relative: 24 %
Lymphs Abs: 1.8 10*3/uL (ref 0.7–4.0)
MCH: 23.1 pg — ABNORMAL LOW (ref 26.0–34.0)
MCHC: 29.6 g/dL — ABNORMAL LOW (ref 30.0–36.0)
MCV: 78 fL — ABNORMAL LOW (ref 80.0–100.0)
Monocytes Absolute: 0.7 10*3/uL (ref 0.1–1.0)
Monocytes Relative: 9 %
Neutro Abs: 4.8 10*3/uL (ref 1.7–7.7)
Neutrophils Relative %: 64 %
Platelets: 234 10*3/uL (ref 150–400)
RBC: 4.46 MIL/uL (ref 3.87–5.11)
RDW: 15.2 % (ref 11.5–15.5)
WBC: 7.5 10*3/uL (ref 4.0–10.5)
nRBC: 0 % (ref 0.0–0.2)

## 2021-11-04 LAB — COMPREHENSIVE METABOLIC PANEL
ALT: 13 U/L (ref 0–44)
AST: 17 U/L (ref 15–41)
Albumin: 3.8 g/dL (ref 3.5–5.0)
Alkaline Phosphatase: 61 U/L (ref 38–126)
Anion gap: 6 (ref 5–15)
BUN: 20 mg/dL (ref 8–23)
CO2: 26 mmol/L (ref 22–32)
Calcium: 8.8 mg/dL — ABNORMAL LOW (ref 8.9–10.3)
Chloride: 108 mmol/L (ref 98–111)
Creatinine, Ser: 1.1 mg/dL — ABNORMAL HIGH (ref 0.44–1.00)
GFR, Estimated: 52 mL/min — ABNORMAL LOW (ref >60)
Glucose, Bld: 103 mg/dL — ABNORMAL HIGH (ref 70–99)
Potassium: 5.1 mmol/L (ref 3.5–5.1)
Sodium: 140 mmol/L (ref 135–145)
Total Bilirubin: 0.3 mg/dL (ref 0.3–1.2)
Total Protein: 7.2 g/dL (ref 6.5–8.1)

## 2021-11-04 LAB — PROTIME-INR
INR: 1 (ref 0.8–1.2)
Prothrombin Time: 13.1 seconds (ref 11.4–15.2)

## 2021-11-04 NOTE — Progress Notes (Signed)
Covid test on 11/18/21 at _________ Come thru main entrance at [facility] . Have a seat in the lobby on the right as you come thru the door.  Call [phone] and let them know you are here for covid testing.                  Amy Powell  11/04/2021   Your procedure is scheduled on:    11/21/21    Report to [facility] Main  Entrance   Report to admitting at  1100 AM     Call this number if you have problems the morning of surgery [phone]           REMEMBER: FOLLOW ALL YOUR BOWEL PREP INSTRUCTIONS WITH CLEAR LIQUIDS FROM [place]. DRINK 2 PRESURGERY ENSURE DRINKS THE NIGHT BEFORE SURGERY AT 1000 PM AND 1 PRESURGERY DRINK THE DAY OF THE PROCEDURE 3 HOURS PRIOR TO SCHEDULED SURGERY.  NOTHING BY MOUTH EXCEPT CLEAR LIQUIDS UNTIL THREE HOURS PRIOR TO SCHEDULED SURGERY. PLEASE FINISH PRESURGERY 3RD  ENSURE  DRINK PER SURGEON ORDER 3 HOURS PRIOR TO SCHEDULED SURGERY TIME WHICH NEEDS TO BE COMPLETED AT ____ 8338 am _____.     CLEAR LIQUID DIET   Foods Allowed                                                                      Coffee and tea, regular and decaf                           Plain Jell-O any favor except red or purple                                         Fruit ices (not with fruit pulp)                                      Iced Popsicles                                     Carbonated beverages, regular and diet                                    Cranberry, grape and apple juices Sports drinks like Gatorade Lightly seasoned clear broth or consume(fat free) Sugar  _____________________________________________________________________      BRUSH YOUR TEETH MORNING OF SURGERY AND RINSE YOUR MOUTH OUT, NO CHEWING GUM CANDY OR MINTS.     Take these medicines the morning of surgery with A SIP OF WATER:  none   DO NOT TAKE ANY DIABETIC MEDICATIONS DAY OF YOUR SURGERY                               You may not have any metal on your body  including hair pins and  piercings  Do not wear jewelry, make-up, lotions, powders or perfumes, deodorant             Do not wear nail polish on your fingernails.  Do not shave  48 hours prior to surgery.              Men may shave face and neck.   Do not bring valuables to the hospital. [place].  Contacts, dentures or bridgework may not be worn into surgery.  Leave suitcase in the car. After surgery it may be brought to your room.                 Please read over the following fact sheets you were given: _____________________________________________________________________  [facility] [place] - Preparing for Surgery Before surgery, you can play an important role.  Because skin is not sterile, your skin needs to be as free of germs as possible.  You can reduce the number of germs on your skin by washing with CHG (chlorahexidine gluconate) soap before surgery.  CHG is an antiseptic cleaner which kills germs and bonds with the skin to continue killing germs even after washing. Please DO NOT use if you have an allergy to CHG or antibacterial soaps.  If your skin becomes reddened/irritated stop using the CHG and inform your nurse when you arrive at Short Stay. Do not shave (including legs and underarms) for at least 48 hours prior to the first CHG shower.  You may shave your face/neck. Please follow these instructions carefully:  1.  Shower with CHG Soap the night before surgery and the  morning of Surgery.  2.  If you choose to wash your hair, wash your hair first as usual with your  normal  shampoo.  3.  After you shampoo, rinse your hair and body thoroughly to remove the  shampoo.                           4.  Use CHG as you would any other liquid soap.  You can apply chg directly  to the skin and wash                       Gently with a scrungie or clean washcloth.  5.  Apply the CHG Soap to your body ONLY FROM THE NECK DOWN.   Do not use  on face/ open                           Wound or open sores. Avoid contact with eyes, ears mouth and genitals (private parts).                       Wash face,  Genitals (private parts) with your normal soap.             6.  Wash thoroughly, paying special attention to the area where your surgery  will be performed.  7.  Thoroughly rinse your body with warm water from the neck down.  8.  DO NOT shower/wash with your normal soap after using and rinsing off  the CHG Soap.                9.  Pat yourself dry with a clean towel.  10.  Wear clean pajamas.            11.  Place clean sheets on your bed the night of your first shower and do not  sleep with pets. Day of Surgery : Do not apply any lotions/deodorants the morning of surgery.  Please wear clean clothes to the hospital/surgery center.  FAILURE TO FOLLOW THESE INSTRUCTIONS MAY RESULT IN THE CANCELLATION OF YOUR SURGERY PATIENT SIGNATURE_________________________________  NURSE SIGNATURE__________________________________  ________________________________________________________________________

## 2021-11-06 LAB — TYPE AND SCREEN
ABO/RH(D): O POS
Antibody Screen: POSITIVE
PT AG Type: NEGATIVE

## 2021-11-18 ENCOUNTER — Encounter (HOSPITAL_COMMUNITY)
Admission: RE | Admit: 2021-11-18 | Discharge: 2021-11-18 | Disposition: A | Discharge status: Home / Self Care | Payer: Medicare HMO | Source: Ambulatory Visit | Attending: Surgery | Admitting: Surgery

## 2021-11-18 ENCOUNTER — Other Ambulatory Visit: Payer: Self-pay

## 2021-11-18 DIAGNOSIS — Z01812 Encounter for preprocedural laboratory examination: Secondary | ICD-10-CM | POA: Insufficient documentation

## 2021-11-18 DIAGNOSIS — Z20822 Contact with and (suspected) exposure to covid-19: Secondary | ICD-10-CM | POA: Insufficient documentation

## 2021-11-18 DIAGNOSIS — Z01818 Encounter for other preprocedural examination: Secondary | ICD-10-CM

## 2021-11-18 LAB — SARS CORONAVIRUS 2 (TAT 6-24 HRS): SARS Coronavirus 2: NEGATIVE

## 2021-11-21 ENCOUNTER — Inpatient Hospital Stay (HOSPITAL_COMMUNITY): Payer: Medicare HMO | Admitting: Certified Registered Nurse Anesthetist

## 2021-11-21 ENCOUNTER — Other Ambulatory Visit: Payer: Self-pay

## 2021-11-21 ENCOUNTER — Inpatient Hospital Stay (HOSPITAL_COMMUNITY): Payer: Medicare HMO | Admitting: Physician Assistant

## 2021-11-21 ENCOUNTER — Encounter (HOSPITAL_COMMUNITY)
Admission: RE | Disposition: A | Discharge status: Home / Self Care | Payer: Self-pay | Source: Ambulatory Visit | Attending: Surgery

## 2021-11-21 ENCOUNTER — Encounter (HOSPITAL_COMMUNITY): Payer: Self-pay | Admitting: Surgery

## 2021-11-21 ENCOUNTER — Inpatient Hospital Stay (HOSPITAL_COMMUNITY)
Admission: RE | Admit: 2021-11-21 | Discharge: 2021-11-23 | DRG: 330 | Disposition: A | Discharge status: Home / Self Care | Payer: Medicare HMO | Attending: Surgery | Admitting: Surgery

## 2021-11-21 DIAGNOSIS — Z841 Family history of disorders of kidney and ureter: Secondary | ICD-10-CM | POA: Diagnosis not present

## 2021-11-21 DIAGNOSIS — C180 Malignant neoplasm of cecum: Principal | ICD-10-CM | POA: Diagnosis present

## 2021-11-21 DIAGNOSIS — N183 Chronic kidney disease, stage 3 unspecified: Secondary | ICD-10-CM

## 2021-11-21 DIAGNOSIS — Z9049 Acquired absence of other specified parts of digestive tract: Secondary | ICD-10-CM

## 2021-11-21 DIAGNOSIS — C182 Malignant neoplasm of ascending colon: Secondary | ICD-10-CM | POA: Diagnosis not present

## 2021-11-21 DIAGNOSIS — K08109 Complete loss of teeth, unspecified cause, unspecified class: Secondary | ICD-10-CM | POA: Diagnosis not present

## 2021-11-21 DIAGNOSIS — K5730 Diverticulosis of large intestine without perforation or abscess without bleeding: Secondary | ICD-10-CM | POA: Diagnosis present

## 2021-11-21 DIAGNOSIS — D509 Iron deficiency anemia, unspecified: Secondary | ICD-10-CM | POA: Diagnosis present

## 2021-11-21 DIAGNOSIS — C189 Malignant neoplasm of colon, unspecified: Secondary | ICD-10-CM

## 2021-11-21 DIAGNOSIS — E785 Hyperlipidemia, unspecified: Secondary | ICD-10-CM | POA: Diagnosis not present

## 2021-11-21 DIAGNOSIS — M810 Age-related osteoporosis without current pathological fracture: Secondary | ICD-10-CM | POA: Diagnosis not present

## 2021-11-21 DIAGNOSIS — D62 Acute posthemorrhagic anemia: Secondary | ICD-10-CM | POA: Diagnosis present

## 2021-11-21 DIAGNOSIS — Z90710 Acquired absence of both cervix and uterus: Secondary | ICD-10-CM | POA: Diagnosis not present

## 2021-11-21 DIAGNOSIS — Z20822 Contact with and (suspected) exposure to covid-19: Secondary | ICD-10-CM | POA: Diagnosis present

## 2021-11-21 DIAGNOSIS — Z880 Allergy status to penicillin: Secondary | ICD-10-CM

## 2021-11-21 DIAGNOSIS — K6389 Other specified diseases of intestine: Secondary | ICD-10-CM | POA: Diagnosis not present

## 2021-11-21 DIAGNOSIS — D120 Benign neoplasm of cecum: Secondary | ICD-10-CM | POA: Diagnosis not present

## 2021-11-21 HISTORY — PX: LAPAROSCOPIC RIGHT HEMI COLECTOMY: SHX5926

## 2021-11-21 SURGERY — LAPAROSCOPIC RIGHT HEMI COLECTOMY
Anesthesia: General | Laterality: Right

## 2021-11-21 MED ORDER — CHLORHEXIDINE GLUCONATE CLOTH 2 % EX PADS: 6.0000 | MEDICATED_PAD | Freq: Once | CUTANEOUS | Status: DC

## 2021-11-21 MED ORDER — HEPARIN SODIUM (PORCINE) 5000 UNIT/ML IJ SOLN
5000.0000 [IU] | Freq: Once | INTRAMUSCULAR | Status: AC
  Administered 2021-11-21: 5000 [IU] via SUBCUTANEOUS
  Filled 2021-11-21: qty 1

## 2021-11-21 MED ORDER — ENSURE SURGERY PO LIQD
237.0000 mL | Freq: Two times a day (BID) | ORAL | Status: DC
  Administered 2021-11-22 – 2021-11-23 (×2): 237 mL via ORAL

## 2021-11-21 MED ORDER — LIDOCAINE 2% (20 MG/ML) 5 ML SYRINGE
INTRAMUSCULAR | Status: DC | PRN
Start: 2021-11-21 — End: 2021-11-21
  Administered 2021-11-21: 1.5 mg/kg/h via INTRAVENOUS

## 2021-11-21 MED ORDER — CLINDAMYCIN PHOSPHATE 900 MG/50ML IV SOLN
900.0000 mg | INTRAVENOUS | Status: AC
Start: 2021-11-21 — End: 2021-11-21
  Administered 2021-11-21: 900 mg via INTRAVENOUS
  Filled 2021-11-21: qty 50

## 2021-11-21 MED ORDER — ALVIMOPAN 12 MG PO CAPS: 12.0000 mg | ORAL_CAPSULE | ORAL | Status: DC

## 2021-11-21 MED ORDER — TRAMADOL HCL 50 MG PO TABS: 50.0000 mg | ORAL_TABLET | Freq: Four times a day (QID) | ORAL | Status: DC | PRN

## 2021-11-21 MED ORDER — DEXTROSE 5 % IV SOLN: INTRAVENOUS | Status: DC | PRN

## 2021-11-21 MED ORDER — ROCURONIUM BROMIDE 10 MG/ML (PF) SYRINGE
PREFILLED_SYRINGE | INTRAVENOUS | Status: AC
  Filled 2021-11-21: qty 10

## 2021-11-21 MED ORDER — FENTANYL CITRATE (PF) 250 MCG/5ML IJ SOLN
INTRAMUSCULAR | Status: DC | PRN
  Administered 2021-11-21: 50 ug via INTRAVENOUS
  Administered 2021-11-21: 100 ug via INTRAVENOUS
  Administered 2021-11-21: 50 ug via INTRAVENOUS

## 2021-11-21 MED ORDER — SODIUM CHLORIDE 0.9 % IR SOLN
Status: DC | PRN
Start: 2021-11-21 — End: 2021-11-21
  Administered 2021-11-21: 1000 mL

## 2021-11-21 MED ORDER — ALVIMOPAN 12 MG PO CAPS
12.0000 mg | ORAL_CAPSULE | Freq: Two times a day (BID) | ORAL | Status: DC
  Filled 2021-11-21: qty 1

## 2021-11-21 MED ORDER — ENSURE PRE-SURGERY PO LIQD
296.0000 mL | Freq: Once | ORAL | Status: DC
  Filled 2021-11-21: qty 296

## 2021-11-21 MED ORDER — FENTANYL CITRATE (PF) 250 MCG/5ML IJ SOLN
INTRAMUSCULAR | Status: AC
  Filled 2021-11-21: qty 5

## 2021-11-21 MED ORDER — METRONIDAZOLE 500 MG PO TABS: 1000.0000 mg | ORAL_TABLET | ORAL | Status: DC

## 2021-11-21 MED ORDER — HYDROMORPHONE HCL 1 MG/ML IJ SOLN: 0.2500 mg | INTRAMUSCULAR | Status: DC | PRN

## 2021-11-21 MED ORDER — EPHEDRINE 5 MG/ML INJ
INTRAVENOUS | Status: AC
  Filled 2021-11-21: qty 5

## 2021-11-21 MED ORDER — SUGAMMADEX SODIUM 200 MG/2ML IV SOLN
INTRAVENOUS | Status: DC | PRN
Start: 2021-11-21 — End: 2021-11-21
  Administered 2021-11-21: 300 mg via INTRAVENOUS

## 2021-11-21 MED ORDER — BISACODYL 5 MG PO TBEC: 20.0000 mg | DELAYED_RELEASE_TABLET | Freq: Once | ORAL | Status: DC

## 2021-11-21 MED ORDER — KETAMINE HCL 10 MG/ML IJ SOLN
INTRAMUSCULAR | Status: DC | PRN
  Administered 2021-11-21: 30 mg via INTRAVENOUS

## 2021-11-21 MED ORDER — BUPIVACAINE LIPOSOME 1.3 % IJ SUSP
INTRAMUSCULAR | Status: DC | PRN
  Administered 2021-11-21: 20 mL

## 2021-11-21 MED ORDER — BUPIVACAINE LIPOSOME 1.3 % IJ SUSP
INTRAMUSCULAR | Status: AC
  Filled 2021-11-21: qty 20

## 2021-11-21 MED ORDER — ALVIMOPAN 12 MG PO CAPS
12.0000 mg | ORAL_CAPSULE | ORAL | Status: AC
Start: 2021-11-21 — End: 2021-11-21
  Administered 2021-11-21: 12 mg via ORAL
  Filled 2021-11-21: qty 1

## 2021-11-21 MED ORDER — ALBUMIN HUMAN 5 % IV SOLN
INTRAVENOUS | Status: AC
  Filled 2021-11-21: qty 250

## 2021-11-21 MED ORDER — DIPHENHYDRAMINE HCL 12.5 MG/5ML PO ELIX: 12.5000 mg | ORAL_SOLUTION | Freq: Four times a day (QID) | ORAL | Status: DC | PRN

## 2021-11-21 MED ORDER — SODIUM CHLORIDE (PF) 0.9 % IJ SOLN
INTRAMUSCULAR | Status: AC
  Filled 2021-11-21: qty 50

## 2021-11-21 MED ORDER — BUPIVACAINE LIPOSOME 1.3 % IJ SUSP: 20.0000 mL | Freq: Once | INTRAMUSCULAR | Status: DC

## 2021-11-21 MED ORDER — ONDANSETRON HCL 4 MG/2ML IJ SOLN
4.0000 mg | Freq: Four times a day (QID) | INTRAMUSCULAR | Status: DC | PRN
  Administered 2021-11-23: 4 mg via INTRAVENOUS
  Filled 2021-11-21: qty 2

## 2021-11-21 MED ORDER — ONDANSETRON HCL 4 MG/2ML IJ SOLN
INTRAMUSCULAR | Status: AC
  Filled 2021-11-21: qty 2

## 2021-11-21 MED ORDER — ALUM & MAG HYDROXIDE-SIMETH 200-200-20 MG/5ML PO SUSP: 30.0000 mL | Freq: Four times a day (QID) | ORAL | Status: DC | PRN

## 2021-11-21 MED ORDER — LACTATED RINGERS IV SOLN: INTRAVENOUS | Status: DC

## 2021-11-21 MED ORDER — PROPOFOL 10 MG/ML IV BOLUS
INTRAVENOUS | Status: AC
  Filled 2021-11-21: qty 20

## 2021-11-21 MED ORDER — DEXAMETHASONE SODIUM PHOSPHATE 10 MG/ML IJ SOLN
INTRAMUSCULAR | Status: DC | PRN
  Administered 2021-11-21: 5 mg via INTRAVENOUS

## 2021-11-21 MED ORDER — ONDANSETRON HCL 4 MG PO TABS: 4.0000 mg | ORAL_TABLET | Freq: Four times a day (QID) | ORAL | Status: DC | PRN

## 2021-11-21 MED ORDER — HYDROMORPHONE HCL 1 MG/ML IJ SOLN: 0.5000 mg | INTRAMUSCULAR | Status: DC | PRN

## 2021-11-21 MED ORDER — GENTAMICIN SULFATE 40 MG/ML IJ SOLN
320.0000 mg | INTRAVENOUS | Status: AC
  Administered 2021-11-21: 320 mg via INTRAVENOUS
  Filled 2021-11-21: qty 8

## 2021-11-21 MED ORDER — LIDOCAINE HCL (PF) 2 % IJ SOLN
INTRAMUSCULAR | Status: AC
  Filled 2021-11-21: qty 5

## 2021-11-21 MED ORDER — ONDANSETRON HCL 4 MG/2ML IJ SOLN
4.0000 mg | Freq: Once | INTRAMUSCULAR | Status: AC | PRN
  Administered 2021-11-21: 4 mg via INTRAVENOUS

## 2021-11-21 MED ORDER — NEOMYCIN SULFATE 500 MG PO TABS
1000.0000 mg | ORAL_TABLET | ORAL | Status: DC
Start: 2021-11-21 — End: 2021-11-21

## 2021-11-21 MED ORDER — LIDOCAINE 2% (20 MG/ML) 5 ML SYRINGE
INTRAMUSCULAR | Status: DC | PRN
  Administered 2021-11-21: 40 mg via INTRAVENOUS

## 2021-11-21 MED ORDER — ROCURONIUM BROMIDE 10 MG/ML (PF) SYRINGE
PREFILLED_SYRINGE | INTRAVENOUS | Status: DC | PRN
Start: 2021-11-21 — End: 2021-11-21
  Administered 2021-11-21: 100 mg via INTRAVENOUS

## 2021-11-21 MED ORDER — DIPHENHYDRAMINE HCL 50 MG/ML IJ SOLN: 12.5000 mg | Freq: Four times a day (QID) | INTRAMUSCULAR | Status: DC | PRN

## 2021-11-21 MED ORDER — CHLORHEXIDINE GLUCONATE 0.12 % MT SOLN
15.0000 mL | Freq: Once | OROMUCOSAL | Status: AC
  Administered 2021-11-21: 15 mL via OROMUCOSAL

## 2021-11-21 MED ORDER — DEXAMETHASONE SODIUM PHOSPHATE 10 MG/ML IJ SOLN
INTRAMUSCULAR | Status: AC
  Filled 2021-11-21: qty 1

## 2021-11-21 MED ORDER — ENSURE PRE-SURGERY PO LIQD
592.0000 mL | Freq: Once | ORAL | Status: DC
  Filled 2021-11-21: qty 592

## 2021-11-21 MED ORDER — ACETAMINOPHEN 500 MG PO TABS
1000.0000 mg | ORAL_TABLET | Freq: Four times a day (QID) | ORAL | Status: DC
  Administered 2021-11-21 – 2021-11-23 (×7): 1000 mg via ORAL
  Filled 2021-11-21 (×7): qty 2

## 2021-11-21 MED ORDER — ACETAMINOPHEN 500 MG PO TABS
1000.0000 mg | ORAL_TABLET | ORAL | Status: AC
  Administered 2021-11-21: 1000 mg via ORAL
  Filled 2021-11-21: qty 2

## 2021-11-21 MED ORDER — BUPIVACAINE-EPINEPHRINE (PF) 0.25% -1:200000 IJ SOLN
INTRAMUSCULAR | Status: AC
  Filled 2021-11-21: qty 30

## 2021-11-21 MED ORDER — LACTATED RINGERS IR SOLN
Status: DC | PRN
  Administered 2021-11-21: 1000 mL

## 2021-11-21 MED ORDER — HEPARIN SODIUM (PORCINE) 5000 UNIT/ML IJ SOLN
5000.0000 [IU] | Freq: Three times a day (TID) | INTRAMUSCULAR | Status: DC
  Administered 2021-11-21 – 2021-11-23 (×5): 5000 [IU] via SUBCUTANEOUS
  Filled 2021-11-21 (×5): qty 1

## 2021-11-21 MED ORDER — IBUPROFEN 400 MG PO TABS: 600.0000 mg | ORAL_TABLET | Freq: Four times a day (QID) | ORAL | Status: DC | PRN

## 2021-11-21 MED ORDER — SIMETHICONE 80 MG PO CHEW: 40.0000 mg | CHEWABLE_TABLET | Freq: Four times a day (QID) | ORAL | Status: DC | PRN

## 2021-11-21 MED ORDER — POLYETHYLENE GLYCOL 3350 17 GM/SCOOP PO POWD: 1.0000 | Freq: Once | ORAL | Status: DC

## 2021-11-21 MED ORDER — STERILE WATER FOR INJECTION IJ SOLN
INTRAMUSCULAR | Status: DC | PRN
  Administered 2021-11-21: 15 mL via INTRAVESICAL

## 2021-11-21 MED ORDER — BUPIVACAINE-EPINEPHRINE 0.25% -1:200000 IJ SOLN
INTRAMUSCULAR | Status: DC | PRN
  Administered 2021-11-21: 30 mL

## 2021-11-21 MED ORDER — EPHEDRINE SULFATE-NACL 50-0.9 MG/10ML-% IV SOSY
PREFILLED_SYRINGE | INTRAVENOUS | Status: DC | PRN
  Administered 2021-11-21 (×2): 5 mg via INTRAVENOUS

## 2021-11-21 MED ORDER — KETAMINE HCL 50 MG/5ML IJ SOSY
PREFILLED_SYRINGE | INTRAMUSCULAR | Status: AC
  Filled 2021-11-21: qty 5

## 2021-11-21 MED ORDER — PROPOFOL 10 MG/ML IV BOLUS
INTRAVENOUS | Status: DC | PRN
  Administered 2021-11-21: 80 mg via INTRAVENOUS

## 2021-11-21 MED ORDER — PHENYLEPHRINE HCL-NACL 20-0.9 MG/250ML-% IV SOLN
INTRAVENOUS | Status: DC | PRN
  Administered 2021-11-21: 35 ug/min via INTRAVENOUS

## 2021-11-21 MED ORDER — ORAL CARE MOUTH RINSE: 15.0000 mL | Freq: Once | OROMUCOSAL | Status: AC

## 2021-11-21 MED ORDER — PHENYLEPHRINE 40 MCG/ML (10ML) SYRINGE FOR IV PUSH (FOR BLOOD PRESSURE SUPPORT)
PREFILLED_SYRINGE | INTRAVENOUS | Status: DC | PRN
  Administered 2021-11-21: 160 ug via INTRAVENOUS

## 2021-11-21 MED ORDER — STERILE WATER FOR INJECTION IJ SOLN
INTRAMUSCULAR | Status: AC
  Filled 2021-11-21: qty 10

## 2021-11-21 MED ORDER — ONDANSETRON HCL 4 MG/2ML IJ SOLN
INTRAMUSCULAR | Status: DC | PRN
  Administered 2021-11-21: 4 mg via INTRAVENOUS

## 2021-11-21 MED ORDER — HYDRALAZINE HCL 20 MG/ML IJ SOLN: 10.0000 mg | INTRAMUSCULAR | Status: DC | PRN

## 2021-11-21 MED ORDER — LIDOCAINE HCL 2 % IJ SOLN
INTRAMUSCULAR | Status: AC
  Filled 2021-11-21: qty 20

## 2021-11-21 MED ORDER — ACETAMINOPHEN 10 MG/ML IV SOLN: 1000.0000 mg | Freq: Once | INTRAVENOUS | Status: DC | PRN

## 2021-11-21 SURGICAL SUPPLY — 77 items
ADAPTER GOLDBERG URETERAL (ADAPTER) IMPLANT
APPLIER CLIP ROT 10 11.4 M/L (STAPLE)
BAG COUNTER SPONGE SURGICOUNT (BAG) IMPLANT
BAG URO CATCHER STRL LF (MISCELLANEOUS) ×3 IMPLANT
BLADE CLIPPER SURG (BLADE) IMPLANT
CABLE HIGH FREQUENCY MONO STRZ (ELECTRODE) ×6 IMPLANT
CATH URETL OPEN 5X70 (CATHETERS) ×1 IMPLANT
CELLS DAT CNTRL 66122 CELL SVR (MISCELLANEOUS) ×2 IMPLANT
CHLORAPREP W/TINT 26 (MISCELLANEOUS) ×3 IMPLANT
CLIP APPLIE ROT 10 11.4 M/L (STAPLE) IMPLANT
CLOTH BEACON ORANGE TIMEOUT ST (SAFETY) ×3 IMPLANT
DERMABOND ADVANCED (GAUZE/BANDAGES/DRESSINGS) ×1
DERMABOND ADVANCED .7 DNX12 (GAUZE/BANDAGES/DRESSINGS) IMPLANT
DISSECTOR BLUNT TIP ENDO 5MM (MISCELLANEOUS) IMPLANT
ELECT REM PT RETURN 15FT ADLT (MISCELLANEOUS) ×3 IMPLANT
GAUZE SPONGE 4X4 12PLY STRL (GAUZE/BANDAGES/DRESSINGS) ×3 IMPLANT
GLOVE SURG ENC MOIS LTX SZ7.5 (GLOVE) ×6 IMPLANT
GLOVE SURG ENC TEXT LTX SZ7.5 (GLOVE) ×3 IMPLANT
GLOVE SURG NEOPR MICRO LF SZ8 (GLOVE) ×6 IMPLANT
GOWN STRL REUS W/TWL LRG LVL3 (GOWN DISPOSABLE) ×3 IMPLANT
GOWN STRL REUS W/TWL XL LVL3 (GOWN DISPOSABLE) ×12 IMPLANT
GUIDEWIRE ANG ZIPWIRE 038X150 (WIRE) IMPLANT
GUIDEWIRE STR DUAL SENSOR (WIRE) ×1 IMPLANT
IRRIG SUCT STRYKERFLOW 2 WTIP (MISCELLANEOUS) ×3
IRRIGATION SUCT STRKRFLW 2 WTP (MISCELLANEOUS) IMPLANT
KIT PROCEDURE DA VINCI SI (MISCELLANEOUS) ×3
KIT PROCEDURE DVNC SI (MISCELLANEOUS) ×2 IMPLANT
KIT TURNOVER KIT A (KITS) ×1 IMPLANT
LIGASURE IMPACT 36 18CM CVD LR (INSTRUMENTS) IMPLANT
MANIFOLD NEPTUNE II (INSTRUMENTS) ×3 IMPLANT
NS IRRIG 1000ML POUR BTL (IV SOLUTION) ×3 IMPLANT
PACK COLON (CUSTOM PROCEDURE TRAY) ×3 IMPLANT
PACK CYSTO (CUSTOM PROCEDURE TRAY) ×3 IMPLANT
PAD POSITIONING PINK XL (MISCELLANEOUS) IMPLANT
PENCIL SMOKE EVACUATOR (MISCELLANEOUS) ×1 IMPLANT
PROTECTOR NERVE ULNAR (MISCELLANEOUS) IMPLANT
RELOAD PROXIMATE 75MM BLUE (ENDOMECHANICALS) ×6 IMPLANT
RELOAD STAPLE 75 3.8 BLU REG (ENDOMECHANICALS) IMPLANT
RETRACTOR WND ALEXIS 18 MED (MISCELLANEOUS) IMPLANT
RTRCTR WOUND ALEXIS 18CM MED (MISCELLANEOUS) ×3
SCISSORS LAP 5X35 DISP (ENDOMECHANICALS) ×3 IMPLANT
SEALER TISSUE G2 STRG ARTC 35C (ENDOMECHANICALS) IMPLANT
SET TUBE SMOKE EVAC HIGH FLOW (TUBING) ×3 IMPLANT
SHEARS HARMONIC ACE PLUS 36CM (ENDOMECHANICALS) ×1 IMPLANT
SLEEVE ADV FIXATION 5X100MM (TROCAR) ×6 IMPLANT
SLEEVE ENDOPATH XCEL 5M (ENDOMECHANICALS) ×3 IMPLANT
SPIKE FLUID TRANSFER (MISCELLANEOUS) ×3 IMPLANT
STAPLER 90 3.5 STAND SLIM (STAPLE) ×3
STAPLER 90 3.5 STD SLIM (STAPLE) IMPLANT
STAPLER GUN LINEAR PROX 60 (STAPLE) IMPLANT
STAPLER PROXIMATE 75MM BLUE (STAPLE) ×2 IMPLANT
STAPLER VISISTAT 35W (STAPLE) ×3 IMPLANT
SUT MNCRL AB 4-0 PS2 18 (SUTURE) ×3 IMPLANT
SUT PDS AB 1 CT1 27 (SUTURE) ×6 IMPLANT
SUT PROLENE 2 0 CT2 30 (SUTURE) IMPLANT
SUT PROLENE 2 0 KS (SUTURE) IMPLANT
SUT SILK 2 0 (SUTURE) ×3
SUT SILK 2 0 SH CR/8 (SUTURE) ×3 IMPLANT
SUT SILK 2-0 18XBRD TIE 12 (SUTURE) ×2 IMPLANT
SUT SILK 3 0 (SUTURE) ×3
SUT SILK 3 0 SH CR/8 (SUTURE) ×3 IMPLANT
SUT SILK 3-0 18XBRD TIE 12 (SUTURE) ×2 IMPLANT
SYS LAPSCP GELPORT 120MM (MISCELLANEOUS)
SYS WOUND ALEXIS 18CM MED (MISCELLANEOUS) ×3
SYSTEM LAPSCP GELPORT 120MM (MISCELLANEOUS) IMPLANT
SYSTEM WOUND ALEXIS 18CM MED (MISCELLANEOUS) ×2 IMPLANT
TAPE CLOTH 4X10 WHT NS (GAUZE/BANDAGES/DRESSINGS) IMPLANT
TOWEL OR 17X26 10 PK STRL BLUE (TOWEL DISPOSABLE) ×3 IMPLANT
TRAY FOLEY MTR SLVR 14FR STAT (SET/KITS/TRAYS/PACK) ×3 IMPLANT
TRAY FOLEY MTR SLVR 16FR STAT (SET/KITS/TRAYS/PACK) IMPLANT
TROCAR ADV FIXATION 5X100MM (TROCAR) ×3 IMPLANT
TROCAR BALLN 12MMX100 BLUNT (TROCAR) ×3 IMPLANT
TROCAR XCEL 12X100 BLDLESS (ENDOMECHANICALS) ×3 IMPLANT
TROCAR XCEL NON-BLD 11X100MML (ENDOMECHANICALS) IMPLANT
TUBING CONNECTING 10 (TUBING) ×9 IMPLANT
TUBING UROLOGY SET (TUBING) IMPLANT
YANKAUER SUCT BULB TIP NO VENT (SUCTIONS) ×3 IMPLANT

## 2021-11-21 NOTE — Transfer of Care (Signed)
Immediate Anesthesia Transfer of Care Note  Patient: Amy Powell  Procedure(s) Performed: LAPAROSCOPIC RIGHT HEMI COLECTOMY, SMALL BOWEL RESECTION (Right) CYSTOSCOPY with FIREFLY INJECTION (Bilateral)  Patient Location: PACU  Anesthesia Type:General  Level of Consciousness: awake, alert  and oriented  Airway & Oxygen Therapy: Patient Spontanous Breathing and Patient connected to face mask oxygen  Post-op Assessment: Report given to RN and Post -op Vital signs reviewed and stable  Post vital signs: Reviewed and stable  Last Vitals:  Vitals Value Taken Time  BP 160/80 11/21/21 1426  Temp    Pulse 85 11/21/21 1427  Resp    SpO2 100 % 11/21/21 1427  Vitals shown include unvalidated device data.  Last Pain: There were no vitals filed for this visit.       Complications: No notable events documented.

## 2021-11-21 NOTE — Op Note (Signed)
Preoperative diagnosis:  1. Cecal adenocarcinoma  Postoperative diagnosis: 1. same  Procedure(s): 1. Cystoscopy with bilateral ureteral catheterization for firefly injection  Surgeon: Dr. Donald Pore  Anesthesia: general  Complications: none  EBL: <1 cc  Intraoperative findings: unremarkable  Indication: identification of ureters for abdominal/pelvic surgery  Description of procedure:   With appropriate consent having been obtained, the patient was brought to the operative suite. Anesthesia was induced and the patient was prepped and draped in the usual sterile fashion. A timeout was performed  Thereafter the rigid cystoscope was carefully inserted into the bladder. The bladder was inspected thoroughly and no abnormalities were identified.   The right ureteral orifice was identified and cannulated with a 6 fr open ended ureteral catheter. It was easily advanced to the level of the collecting system. The ureteral catheter was then slowly withdrawn while simultaneously injecting the firefly solution over the length of the ureter. Approximately 8 cc total were injected.  The procedure was then repeated in an identical fashion on the left.   A 16 fr foley was then placed in a sterile fashion with immediate return of clear blue/green urine, and the balloon was inflated with 10 cc sterile water.   Donald Pore MD 11/21/2021, 12:35 PM  [facility]  Pager: [phone]

## 2021-11-21 NOTE — Op Note (Signed)
PATIENT: Amy Powell  76 y.o. female  Patient Care Team: Susy Frizzle, MD as PCP - General (Family Medicine)  PREOP DIAGNOSIS: Colon cancer  POSTOP DIAGNOSIS: Same  PROCEDURE:  Laparoscopic right hemicolectomy Small bowel resection Bilateral transversus abdominus plane (TAP) blocks  SURGEON: Sharon Mt. Jinan Biggins, MD  ASSISTANT: Leighton Ruff, MD  ANESTHESIA: General endotracheal  EBL: 25 mL Total I/O In: 2408 [I.V.:2250; IV Piggyback:158] Out: 225 [Urine:200; Blood:25]  DRAINS: None  SPECIMEN: R Right colon, terminal ileum with additional loop of small bowel en bloc Additional transverse colon lymph node  COUNTS: Sponge, needle and instrument counts were reported correct x2  FINDINGS:  Mass evident in the cecum as it entered the pelvis, as expected based on preoperative imaging.  We were able to visualize the right ureter with the aid of near infrared fluorescence.  The mass did not involve the right ureter or any significant amount of retroperitoneal tissue.  It did however involve a portion of the mid ileum.  This necessitated a separate small bowel resection.  There were enlarged ileocolic lymph nodes that were all included with the specimen.  There was additionally and encountered lymph node at the base of the right branch of the middle hepatic vessels that was included as a separate specimen.  A side to side ileoileal anastomosis was fashioned; additionally, an ileocolic anastomosis fashioned.   NARRATIVE:  The patient was identified & brought into the operating room, placed supine on the operating table and SCDs were applied to the lower extremities. General endotracheal anesthesia was induced.  She was then positioned in the lithotomy position using Allen stirrups with the arms tucked.  She was secured to the table.  Pressure points were then evaluated and padded. Antibiotics were administered.  Dr. Cain Sieve scrubbed for his portion procedure.  Please refer to  his notes for details regarding this. Hair in the region of planned surgery was clipped. The abdomen was prepped and draped in a sterile fashion. A timeout was performed confirming our patient and plan.   Beginning with the extraction port, a supraumbilical incision was made and carried down to the midline fascia. This was then incised with electrocautery. The peritoneum was identified and elevated between clamps and carefully opened sharply. A small Alexis wound protector with a cap and associated port was then placed. The abdomen was insufflated to 15 mmHg with CO2. A laparoscope was placed and camera inspection revealed no evidence of injury. Bilateral TAP blocks were then performed under laparoscopic visualization using a mixture of 0.25% marcaine with epinepherine + Exparel.  3 additional ports were then placed under direct laparoscopic visualization - two in the left hemiabdomen and one in the right abdomen. The abdomen was surveyed. The liver and peritoneum appeared normal.  There were no signs of metastatic disease.  The patient was positioned in trendelenburg with left side down.  The small bowel was retracted out of the pelvis.  The cecal mass is identified at the pelvic inlet overlying the right pelvic sidewall.  There is not appear to be any gross extension into the right pelvic sidewall.  We are able to incise the peritoneum and begin with a lateral to medial approach.  The cecum was able to be mobilized off the right pelvic sidewall.  The ureter is visualized under near infrared fluorescence and we are able to stay clear of this.  There is no gross tumor extension.  The cecum and associated terminal ileum are mobilized medially.  The ascending colon  is mobilized by incising the Maedell Hedger line of Toldt up to the level of the hepatic flexure.  She is then repositioned in reverse Trendelenburg.  The transverse: Omentum was retracted anteriorly.  The transverse colon was reflected inferiorly.  The omentum  was incised.  We are able to enter the lesser sac.  The hepatic flexure was then fully mobilized from this medial approach.  The underlying duodenum was identified in the associated mesocolon and swept free from this taking care to preserve the duodenum free of any injury.  There is a associated loop of ileum that appears to be pulled into this tumor and does appear to be involved.  It is therefore apparent that we will also need to perform a small bowel resection.  At this point, the cecum easily reaches in the left upper quadrant without any issue.   At this point, the abdomen was desufflated and the terminal ileum and right colon delivered through the wound protector.  Beginning with the small bowel resection, Windows were created in the mesenteric border on each respective limb.  We then performed the resection and anastomosis using a Barcelona style technique.  Enterotomies were created.  The small bowel anastomosis is fashioned using a 75 mm GIA blue load stapler.  This is inspected and noted to be intact and hemostatic.  The small bowel was then transected in doing so, the enterotomy is closed using a 75 mm GIA blue load stapler.  The intervening mesentery was then ligated using the Enseal device.  We turned our attention to performing a right hemicolectomy.  The terminal ileum was then transected using a GIA blue load stapler.  The ileocolic pedicle was traced to its origin or able to see both the ileocolic vein as well as the superior mesenteric vein.  She has an enlarged and certainly suspicious lymph nodes extending down his chain.  All apparent suspicious lymph nodes are therefore planned to be included with the resection.  Near its takeoff, we are able to create a window.  This is then clamped and subsequently ligated distally with the Enseal device.  The pedicle was then suture ligated using a 2-0 silk suture.  This is inspected and noted to be hemostatic.  Distal point transection is identified on  the transverse colon a location includes the right branch of middle colic vessels.  A window was created in the mesentery.  The colon is divided using a GIA blue load stapler.  We are able to preserve the main middle colic with a palpable pulse in this associated artery going out to the distal point of transection.  The intervening mesentery was then ligated using Enseal device.  The specimen is passed off as noted above.  There was a transverse mesocolon node at the junction of the right branch and the proper branch of the middle colic vessels that was included as an additional specimen.  Attention was turned to creating the anastomosis. The distal ileum and transverse colon were inspected for orientation to ensure no twisting nor bowel included in the mesenteric defect. This is about 1 foot distal to the small bowel anastomosis. An anastomosis was created between the distal ileum and the transverse colon using a 75 mm GIA blue load stapler. The staple line was inspected and noted to be hemostatic.  The common enterotomy channel was closed using a TA 90 blue load stapler. Hemostasis was achieved at the staple line using 3-0 silk U-stitches. 3-0 silk sutures were used to imbricate the corners  of the staple line as well.  A 2-0 silk suture was placed securing the apex of the anastomosis. The anastomosis was palpated and noted to be widely patent. This was then placed back into the abdomen. The abdomen was then irrigated with sterile saline and hemostasis verified. The omentum was then brought down over the anastomosis. The wound protector cap was replaced and Co2 reinsufflated. The laparoscopic ports were removed under direct visualization and the sites noted to be hemostatic. The Alexis wound protector was removed, counts were reported correct, and we switched to clean instruments, gowns and drapes.   The fascia was then closed using two running #1 PDS sutures.  The skin of all incision sites was closed with 4-0  monocryl subcuticular suture. Dermabond was placed on the port sites and a sterile dressing was placed over the abdominal incision. All counts were reported correct. The patient was then awakened from anesthesia and sent to the post anesthesia care unit in stable condition.    DISPOSITION: PACU in satisfactory condition

## 2021-11-21 NOTE — Anesthesia Procedure Notes (Signed)
Procedure Name: Intubation Date/Time: 11/21/2021 12:00 PM Performed by: Maxwell Caul, CRNA Pre-anesthesia Checklist: Patient identified, Emergency Drugs available, Suction available and Patient being monitored Patient Re-evaluated:Patient Re-evaluated prior to induction Oxygen Delivery Method: Circle system utilized Preoxygenation: Pre-oxygenation with 100% oxygen Induction Type: IV induction Ventilation: Mask ventilation without difficulty Laryngoscope Size: Mac and 4 Grade View: Grade I Tube type: Oral Tube size: 7.0 mm Number of attempts: 1 Airway Equipment and Method: Stylet Placement Confirmation: ETT inserted through vocal cords under direct vision, positive ETCO2 and breath sounds checked- equal and bilateral Secured at: 21 cm Tube secured with: Tape Dental Injury: Teeth and Oropharynx as per pre-operative assessment

## 2021-11-21 NOTE — H&P (Signed)
CC: Here today for surgery  HPI: Amy Powell is an 76 y.o. female with history of HLD, CKD 3, whom is seen in the office today as a referral by Dr. Candis Schatz -she had a positive FIT test and was referred to him for colonoscopy. Colonoscopy is completed 08/07/2021:  1. 2 sessile polyps in the cecum. 2. Large ulcerated nonobstructing mass in the cecum. Approximating closely the ileocecal valve. Biopsied -invasive moderately differentiated adenocarcinoma 3. 2.5 cm polyp in the proximal ascending colon opposite the ileocecal valve 4. 6 sessile polyps in the ascending colon that were removed- tubular adenomas without dysplasia 5. 3 sessile polyps in the hepatic flexure that were removed-tubular adenomas without dysplasia 6. 6 sessile polyps in the transverse colon that were removed- tubular adenomas without dysplasia 7. 3 mm polyp in the rectum that was removed- tubular adenomas without dysplasia 8. Diverticulosis in much of her colon  CEA 08/27/21 - 1.7  CT CAP 09/18/21 -circumferential masslike thickening in the proximal cecum without extension beyond the serosal surface. Single small ileocecal mesenteric lymph node. No evidence of liver metastases or periportal nodal metastases.  Her PCP is Dr. Jenna Luo  She has done well since we met in the office - no new complaints, changes in her health or health history. Reports she is ready for surgery. Tolerated her bowel prep with satisfactory result.   PMH: HLD, CKD (stage 3)  PSH: Hysterectomy via Pfannenstiel for fibroids/menorrhagia. She denies any other abdominal surgical history  FHx: Denies any known family history of colorectal, breast, endometrial or ovarian cancer  Social Hx: Denies use of tobacco/EtOH/illicit drug. She is happily retired. Previously worked in the [employer].  Past Medical History:  Diagnosis Date   CKD (chronic kidney disease) stage 3, GFR 30-59 ml/min (HCC)    HLD (hyperlipidemia)    Osteoporosis      Past Surgical History:  Procedure Laterality Date   ABDOMINAL HYSTERECTOMY     for fibroids, TAHBSO   TONSILLECTOMY      Family History  Problem Relation Age of Onset   Hypertension Mother    Stroke Mother    Diabetes Mother    Alcohol abuse Father    Hypertension Father    Kidney disease Sister    Heart disease Sister    Hypertension Sister    Diabetes Sister    Lung cancer Sister    Heart disease Brother    Heart disease Brother    Breast cancer Neg Hx    Colon cancer Neg Hx    Esophageal cancer Neg Hx    Pancreatic cancer Neg Hx    Stomach cancer Neg Hx     Social:  reports that she has never smoked. She has never used smokeless tobacco. She reports current alcohol use. She reports that she does not use drugs.  Allergies:  Allergies  Allergen Reactions   Penicillins Itching    Has patient had a PCN reaction causing immediate rash, facial/tongue/throat swelling, SOB or lightheadedness with hypotension: Y Has patient had a PCN reaction causing severe rash involving mucus membranes or skin necrosis: Y Has patient had a PCN reaction that required hospitalization: N Has patient had a PCN reaction occurring within the last 10 years: N If all of the above answers are "NO", then may proceed with Cephalosporin use.     Medications: I have reviewed the patient's current medications.  No results found for this or any previous visit (from the past 48 hour(s)).  No results  found.  ROS - all of the below systems have been reviewed with the patient and positives are indicated with bold text General: chills, fever or night sweats Eyes: blurry vision or double vision ENT: epistaxis or sore throat Allergy/Immunology: itchy/watery eyes or nasal congestion Hematologic/Lymphatic: bleeding problems, blood clots or swollen lymph nodes Endocrine: temperature intolerance or unexpected weight changes Breast: new or changing breast lumps or nipple discharge Resp: cough,  shortness of breath, or wheezing CV: chest pain or dyspnea on exertion GI: as per HPI GU: dysuria, trouble voiding, or hematuria MSK: joint pain or joint stiffness Neuro: TIA or stroke symptoms Derm: pruritus and skin lesion changes Psych: anxiety and depression  PE There were no vitals taken for this visit. Constitutional: NAD; conversant; wearing mask Eyes: Moist conjunctiva; no lid lag; anicteric; PERRL Lungs: Normal respiratory effort CV: RRR; no palpable thrills; no pitting edema GI: Abd soft, NT/ND; no palpable hepatosplenomegaly MSK: Normal range of motion of extremities; no clubbing/cyanosis Psychiatric: Appropriate affect; alert and oriented x3  No results found for this or any previous visit (from the past 48 hour(s)).  No results found.   A/P: Amy Powell is an 77 y.o. female with hx of HLD here for evaluation of newly diagnosed cecal adenocarcinoma  Colonoscopy 08/27/21 - mass in cecum, biopsy proven adenocarcinoma  CT CAP 09/18/21 - no evidence of metastatic disease, mass seen in cecum. Comparing this to the delays, this does overlie her right ureter. There is no significant hydroureter apparent.  -Preop clearance from her PCP Dr. Jenna Luo -The anatomy and physiology of the GI tract was reviewed with the patient. The pathophysiology of colon cancer was discussed as well with associated pictures. -We have discussed various different treatment options going forward including surgery (the most definitive) to address this - laparoscopic right hemicolectomy; cystoscopy/right ureteral stent and ICG/firefly (coordinate with [facility]) given proximity to the right ureter. -The planned procedure, material risks (including, but not limited to, pain, bleeding, infection, scarring, need for blood transfusion, damage to surrounding structures- blood vessels/nerves/viscus/organs, damage to ureter, urine leak, leak from anastomosis, need for additional procedures,  scenarios where a stoma may be necessary and where it may be permanent, worsening of pre-existing medical conditions, hernia, recurrence, pneumonia, heart attack, stroke, death) benefits and alternatives to surgery were discussed at length. The patient's questions were answered to her satisfaction, she voiced understanding and elected to proceed with surgery. Additionally, we discussed typical postoperative expectations and the recovery process.  Nadeen Landau, MD [facility], [facility]

## 2021-11-21 NOTE — Anesthesia Preprocedure Evaluation (Addendum)
Anesthesia Evaluation  Patient identified by MRN, date of birth, ID band Patient awake    Reviewed: Allergy & Precautions, NPO status , Patient's Chart, lab work & pertinent test results  Airway Mallampati: I       Dental  (+) Edentulous Upper, Edentulous Lower   Pulmonary neg pulmonary ROS,    Pulmonary exam normal        Cardiovascular negative cardio ROS Normal cardiovascular exam     Neuro/Psych negative neurological ROS  negative psych ROS   GI/Hepatic negative GI ROS, Neg liver ROS,   Endo/Other  negative endocrine ROS  Renal/GU Renal InsufficiencyRenal diseaseCKD III  negative genitourinary   Musculoskeletal negative musculoskeletal ROS (+)   Abdominal Normal abdominal exam  (+)   Peds  Hematology   Anesthesia Other Findings   Reproductive/Obstetrics                            Anesthesia Physical Anesthesia Plan  ASA: 2  Anesthesia Plan: General   Post-op Pain Management: Dilaudid IV   Induction: Intravenous  PONV Risk Score and Plan: 4 or greater and Ondansetron and Dexamethasone  Airway Management Planned: Oral ETT  Additional Equipment: None  Intra-op Plan:   Post-operative Plan: Extubation in OR  Informed Consent: I have reviewed the patients History and Physical, chart, labs and discussed the procedure including the risks, benefits and alternatives for the proposed anesthesia with the patient or authorized representative who has indicated his/her understanding and acceptance.     Dental advisory given  Plan Discussed with: CRNA  Anesthesia Plan Comments:         Anesthesia Quick Evaluation

## 2021-11-22 ENCOUNTER — Encounter (HOSPITAL_COMMUNITY): Payer: Self-pay | Admitting: Surgery

## 2021-11-22 LAB — BASIC METABOLIC PANEL
Anion gap: 6 (ref 5–15)
BUN: 13 mg/dL (ref 8–23)
CO2: 25 mmol/L (ref 22–32)
Calcium: 8.2 mg/dL — ABNORMAL LOW (ref 8.9–10.3)
Chloride: 102 mmol/L (ref 98–111)
Creatinine, Ser: 1.27 mg/dL — ABNORMAL HIGH (ref 0.44–1.00)
GFR, Estimated: 44 mL/min — ABNORMAL LOW (ref >60)
Glucose, Bld: 138 mg/dL — ABNORMAL HIGH (ref 70–99)
Potassium: 3.7 mmol/L (ref 3.5–5.1)
Sodium: 133 mmol/L — ABNORMAL LOW (ref 135–145)

## 2021-11-22 LAB — CBC
HCT: 27.5 % — ABNORMAL LOW (ref 36.0–46.0)
Hemoglobin: 8.4 g/dL — ABNORMAL LOW (ref 12.0–15.0)
MCH: 22.5 pg — ABNORMAL LOW (ref 26.0–34.0)
MCHC: 30.5 g/dL (ref 30.0–36.0)
MCV: 73.7 fL — ABNORMAL LOW (ref 80.0–100.0)
Platelets: 198 10*3/uL (ref 150–400)
RBC: 3.73 MIL/uL — ABNORMAL LOW (ref 3.87–5.11)
RDW: 15.6 % — ABNORMAL HIGH (ref 11.5–15.5)
WBC: 13.3 10*3/uL — ABNORMAL HIGH (ref 4.0–10.5)
nRBC: 0 % (ref 0.0–0.2)

## 2021-11-22 MED ORDER — FERROUS SULFATE 325 (65 FE) MG PO TABS: 325.0000 mg | ORAL_TABLET | Freq: Two times a day (BID) | ORAL | 0 refills | Status: DC

## 2021-11-22 MED ORDER — ASCORBIC ACID 500 MG PO TABS: 500.0000 mg | ORAL_TABLET | Freq: Two times a day (BID) | ORAL | 0 refills | Status: AC

## 2021-11-22 MED ORDER — FERROUS SULFATE 325 (65 FE) MG PO TABS
325.0000 mg | ORAL_TABLET | Freq: Two times a day (BID) | ORAL | Status: DC
  Administered 2021-11-22 – 2021-11-23 (×2): 325 mg via ORAL
  Filled 2021-11-22 (×2): qty 1

## 2021-11-22 MED ORDER — ASCORBIC ACID 500 MG PO TABS
500.0000 mg | ORAL_TABLET | Freq: Two times a day (BID) | ORAL | Status: DC
  Administered 2021-11-22 – 2021-11-23 (×3): 500 mg via ORAL
  Filled 2021-11-22 (×3): qty 1

## 2021-11-22 MED ORDER — TRAMADOL HCL 50 MG PO TABS: 50.0000 mg | ORAL_TABLET | Freq: Four times a day (QID) | ORAL | 0 refills | Status: AC | PRN

## 2021-11-22 NOTE — Progress Notes (Signed)
Transition of Care [facility]) Screening Note  Patient Details  Name: Amy Powell Date of Birth: 1946-07-14  Transition of [facility]) CM/SW Contact:    Sherie Don, LCSW Phone Number: 11/22/2021, 10:43 AM  Transition of Care Department [facility]) has reviewed patient and no TOC needs have been identified at this time. We will continue to monitor patient advancement through interdisciplinary progression rounds. If new patient transition needs arise, please place a TOC consult.

## 2021-11-22 NOTE — Progress Notes (Signed)
Mobility Specialist - Progress Note    11/22/21 1000  Mobility  Activity Ambulated with assistance in hallway  Level of Assistance Standby assist, set-up cues, supervision of patient - no hands on  Assistive Device Other (Comment) (IV Pole)  Distance Ambulated (ft) 350 ft  Activity Response Tolerated well  $Mobility charge 1 Mobility   Pt agreeable to ambulate this morning. Pushed IV pole while ambulating 350 ft in hallway. Denied pain, dizziness, and SOB during session. Pt returned to room after ambulating and was left sitting up in recliner with call bell at side.   [place] Specialist [facility] Services Phone: [phone] 11/22/21, 10:14 AM

## 2021-11-22 NOTE — Progress Notes (Signed)
Patient ambulated in hallway 250 ft.Standby by assist, no assistive devices needed. Tolerated well. No complaints of dizziness or short of breath. Patient initially stated she was light headed but symptom subsided quickly. Vitals remained stable. Patient returned to room sitting on bedside side. Bed alarm on. Call bell in place. Family member at bedside. Patient verbalizes all acute needs met as this time.

## 2021-11-22 NOTE — Anesthesia Postprocedure Evaluation (Signed)
Anesthesia Post Note  Patient: Amy Powell  Procedure(s) Performed: LAPAROSCOPIC RIGHT HEMI COLECTOMY, SMALL BOWEL RESECTION (Right) CYSTOSCOPY with FIREFLY INJECTION (Bilateral)     Patient location during evaluation: PACU Anesthesia Type: General Level of consciousness: awake Pain management: pain level controlled Vital Signs Assessment: post-procedure vital signs reviewed and stable Respiratory status: spontaneous breathing Cardiovascular status: stable Postop Assessment: no apparent nausea or vomiting Anesthetic complications: no   No notable events documented.  Last Vitals:  Vitals:   11/21/21 2149 11/22/21 0537  BP: (!) 148/76 (!) 148/72  Pulse: 79 85  Resp: 18 18  Temp: 36.9 C   SpO2: 100% 98%    Last Pain:  Vitals:   11/21/21 2149  TempSrc: Oral  PainSc:                  Huston Foley

## 2021-11-22 NOTE — Progress Notes (Signed)
Subjective No acute events. Feeling great. Has been up walking around. Currently in recliner with legs crossed. No n/v. Tolerating pitchers of water, ice cream, italian ice. Denies distention. Has been passing flatus and had 3 Bms. Blood tinged initially and then cleared up. Denies abdominal pain. Foley out, voiding on her own.  Objective: Vital signs in last 24 hours: Temp:  [97.6 F (36.4 C)-98.7 F (37.1 C)] 98.7 F (37.1 C) (02/24 0944) Pulse Rate:  [73-90] 90 (02/24 0944) Resp:  [12-18] 18 (02/24 0944) BP: (104-178)/(52-91) 104/52 (02/24 0944) SpO2:  [97 %-100 %] 100 % (02/24 0944) Weight:  [63.5 kg] 63.5 kg (02/23 1536)    Intake/Output from previous day: 02/23 0701 - 02/24 0700 In: 2868 [P.O.:360; I.V.:2350; IV Piggyback:158] Out: 2350 [Urine:2325; Blood:25] Intake/Output this shift: Total I/O In: 360 [P.O.:360] Out: 200 [Urine:200]  Gen: NAD, comfortable CV: RRR Pulm: Normal work of breathing Abd: Soft, NT/ND. Wounds c/d/I without erythema. Ext: SCDs in place  Lab Results: CBC  Recent Labs    11/22/21 0434  WBC 13.3*  HGB 8.4*  HCT 27.5*  PLT 198   BMET Recent Labs    11/22/21 0434  NA 133*  K 3.7  CL 102  CO2 25  GLUCOSE 138*  BUN 13  CREATININE 1.27*  CALCIUM 8.2*   PT/INR No results for input(s): LABPROT, INR in the last 72 hours. ABG No results for input(s): PHART, HCO3 in the last 72 hours.  Invalid input(s): PCO2, PO2  Studies/Results:  Anti-infectives: Anti-infectives (From admission, onward)    Start     Dose/Rate Route Frequency Ordered Stop   11/21/21 1400  neomycin (MYCIFRADIN) tablet 1,000 mg  Status:  Discontinued       See Hyperspace for full Linked Orders Report.   1,000 mg Oral 3 times per day 11/21/21 1056 11/21/21 1059   11/21/21 1400  metroNIDAZOLE (FLAGYL) tablet 1,000 mg  Status:  Discontinued       See Hyperspace for full Linked Orders Report.   1,000 mg Oral 3 times per day 11/21/21 1056 11/21/21 1059   11/21/21  1115  clindamycin (CLEOCIN) IVPB 900 mg       See Hyperspace for full Linked Orders Report.   900 mg 100 mL/hr over 30 Minutes Intravenous 60 min pre-op 11/21/21 1056 11/21/21 1223   11/21/21 1115  gentamicin (GARAMYCIN) 320 mg in dextrose 5 % 100 mL IVPB       See Hyperspace for full Linked Orders Report.   320 mg 108 mL/hr over 60 Minutes Intravenous 60 min pre-op 11/21/21 1056 11/21/21 1249        Assessment/Plan: Patient Active Problem List   Diagnosis Date Noted   S/P right hemicolectomy 11/21/2021   Osteoporosis    CKD (chronic kidney disease) stage 3, GFR 30-59 ml/min (HCC)    HLD (hyperlipidemia)    Closed fracture of lower end of left ulna with routine healing 05/03/2018   BPPV (benign paroxysmal positional vertigo) 01/13/2013   s/p Procedure(s): LAPAROSCOPIC RIGHT HEMI COLECTOMY with additional SMALL BOWEL RESECTION CYSTOSCOPY with FIREFLY INJECTION 11/21/2021  -Doing great -Reviewed her procedure, findings, and early postoperative goals as well as long term plans -Advance to soft diet as tolerated -Cr 1.27 from 1.10. More than adequate UOP. D/C Ibuprofen. Recheck tomorrow -Acute blood loss anemia on top of chronic iron deficiency anemia. Iron + vit C. Recheck hgb tomorrow -Possible discharge tomorrow if continues to do well, reliably tolerating diet   LOS: 1 day   Harrell Gave  Dema Severin, [facility], [place]

## 2021-11-23 LAB — CBC
HCT: 29.3 % — ABNORMAL LOW (ref 36.0–46.0)
Hemoglobin: 8.9 g/dL — ABNORMAL LOW (ref 12.0–15.0)
MCH: 22.8 pg — ABNORMAL LOW (ref 26.0–34.0)
MCHC: 30.4 g/dL (ref 30.0–36.0)
MCV: 75.1 fL — ABNORMAL LOW (ref 80.0–100.0)
Platelets: 218 10*3/uL (ref 150–400)
RBC: 3.9 MIL/uL (ref 3.87–5.11)
RDW: 16 % — ABNORMAL HIGH (ref 11.5–15.5)
WBC: 14.3 10*3/uL — ABNORMAL HIGH (ref 4.0–10.5)
nRBC: 0 % (ref 0.0–0.2)

## 2021-11-23 LAB — BASIC METABOLIC PANEL
Anion gap: 6 (ref 5–15)
BUN: 15 mg/dL (ref 8–23)
CO2: 26 mmol/L (ref 22–32)
Calcium: 8.3 mg/dL — ABNORMAL LOW (ref 8.9–10.3)
Chloride: 99 mmol/L (ref 98–111)
Creatinine, Ser: 1.31 mg/dL — ABNORMAL HIGH (ref 0.44–1.00)
GFR, Estimated: 42 mL/min — ABNORMAL LOW (ref >60)
Glucose, Bld: 126 mg/dL — ABNORMAL HIGH (ref 70–99)
Potassium: 3.4 mmol/L — ABNORMAL LOW (ref 3.5–5.1)
Sodium: 131 mmol/L — ABNORMAL LOW (ref 135–145)

## 2021-11-23 NOTE — Progress Notes (Signed)
2 Days Post-Op   Subjective/Chief Complaint: No complaints. One episode of nausea this am but this has resolved. No vomiting. Wants to go home   Objective: Vital signs in last 24 hours: Temp:  [98.6 F (37 C)-99.3 F (37.4 C)] 98.6 F (37 C) (02/25 0700) Pulse Rate:  [76-94] 94 (02/25 0700) Resp:  [16-18] 18 (02/25 0700) BP: (132-178)/(64-99) 178/99 (02/25 0700) SpO2:  [97 %-100 %] 99 % (02/25 0700) Weight:  [63.7 kg] 63.7 kg (02/25 0508)    Intake/Output from previous day: 02/24 0701 - 02/25 0700 In: 840 [P.O.:840] Out: 200 [Urine:200] Intake/Output this shift: No intake/output data recorded.  General appearance: alert and cooperative Resp: clear to auscultation bilaterally Cardio: regular rate and rhythm GI: soft, nontender. Incision looks good  Lab Results:  Recent Labs    11/22/21 0434 11/23/21 0411  WBC 13.3* 14.3*  HGB 8.4* 8.9*  HCT 27.5* 29.3*  PLT 198 218   BMET Recent Labs    11/22/21 0434 11/23/21 0411  NA 133* 131*  K 3.7 3.4*  CL 102 99  CO2 25 26  GLUCOSE 138* 126*  BUN 13 15  CREATININE 1.27* 1.31*  CALCIUM 8.2* 8.3*   PT/INR No results for input(s): LABPROT, INR in the last 72 hours. ABG No results for input(s): PHART, HCO3 in the last 72 hours.  Invalid input(s): PCO2, PO2  Studies/Results: No results found.  Anti-infectives: Anti-infectives (From admission, onward)    Start     Dose/Rate Route Frequency Ordered Stop   11/21/21 1400  neomycin (MYCIFRADIN) tablet 1,000 mg  Status:  Discontinued       See Hyperspace for full Linked Orders Report.   1,000 mg Oral 3 times per day 11/21/21 1056 11/21/21 1059   11/21/21 1400  metroNIDAZOLE (FLAGYL) tablet 1,000 mg  Status:  Discontinued       See Hyperspace for full Linked Orders Report.   1,000 mg Oral 3 times per day 11/21/21 1056 11/21/21 1059   11/21/21 1115  clindamycin (CLEOCIN) IVPB 900 mg       See Hyperspace for full Linked Orders Report.   900 mg 100 mL/hr over 30  Minutes Intravenous 60 min pre-op 11/21/21 1056 11/21/21 1223   11/21/21 1115  gentamicin (GARAMYCIN) 320 mg in dextrose 5 % 100 mL IVPB       See Hyperspace for full Linked Orders Report.   320 mg 108 mL/hr over 60 Minutes Intravenous 60 min pre-op 11/21/21 1056 11/21/21 1249       Assessment/Plan: s/p Procedure(s): LAPAROSCOPIC RIGHT HEMI COLECTOMY, SMALL BOWEL RESECTION (Right) CYSTOSCOPY with FIREFLY INJECTION (Bilateral) Advance diet Discharge  LOS: 2 days    Autumn Messing III 11/23/2021

## 2021-11-23 NOTE — Progress Notes (Signed)
°   11/23/21 1200  Mobility  Activity Ambulated independently in hallway  Level of Assistance Independent  Assistive Device None  Distance Ambulated (ft) 400 ft  Activity Response Tolerated well  $Mobility charge 1 Mobility   Pt agreeable to mobilize this morning. Ambulated about 465ft in hall with no AD, tolerated well. No complaints. Left pt in chair, call bell at side. RN/NT notified of session.   [place] Specialist Acute Rehab Services Office: [phone]

## 2021-11-25 LAB — TYPE AND SCREEN
ABO/RH(D): O POS
Antibody Screen: POSITIVE
Donor AG Type: NEGATIVE
Donor AG Type: NEGATIVE
Unit division: 0
Unit division: 0

## 2021-11-25 LAB — BPAM RBC
Blood Product Expiration Date: 202303082359
Blood Product Expiration Date: 202303092359
Unit Type and Rh: 5100
Unit Type and Rh: 5100

## 2021-11-26 NOTE — Discharge Summary (Signed)
[facility] Discharge Summary   Patient ID: Amy Powell MRN: [id number] DOB/AGE: Sep 19, 1946 76 y.o.  Admit date: 11/21/2021 Discharge date: 11/26/2021  Admitting Diagnosis: Colon cancer  Discharge Diagnosis Patient Active Problem List   Diagnosis Date Noted   S/P right hemicolectomy 11/21/2021   Osteoporosis    CKD (chronic kidney disease) stage 3, GFR 30-59 ml/min (HCC)    HLD (hyperlipidemia)    Closed fracture of lower end of left ulna with routine healing 05/03/2018   BPPV (benign paroxysmal positional vertigo) 01/13/2013    Consultants Urology   Imaging: No results found.  Procedures Dr. Dema Severin (11/21/2021) - Laparoscopic right hemicolectomy, small bowel resection, bilateral transversus abdominus plane (TAP) blocks Dr. Cain Sieve (11/21/2021) - Cystoscopy with bilateral ureteral catheterization for firefly injection  Hospital Course:  Amy Powell is a 76yo female with a h/o newly diagnosed cecal adenocarcinoma who was admitted [facility].  She underwent Laparoscopic right hemicolectomy, small bowel resection, bilateral transversus abdominus plane (TAP) blocks on 11/21/21. Urology was consulted and performed cystoscopy with bilateral ureteral catheterization for firefly injection. Patient tolerated procedure well and was transferred to the floor. Diet advanced as tolerated. Noted to have acute on chronic anemia and was started on iron and vitamin C. On POD#2 the patient was felt stable for discharge home.  Patient will follow up as below and knows to call with questions or concerns.     I was not directly involved in this patient's care therefore the information in this discharge summary was taken from the chart.    Allergies as of 11/23/2021       Reactions   Penicillins Itching   Has patient had a PCN reaction causing immediate rash, facial/tongue/throat swelling, SOB or lightheadedness with hypotension: Y Has patient had a PCN reaction  causing severe rash involving mucus membranes or skin necrosis: Y Has patient had a PCN reaction that required hospitalization: N Has patient had a PCN reaction occurring within the last 10 years: N If all of the above answers are "NO", then may proceed with Cephalosporin use.        Medication List     TAKE these medications    ascorbic acid 500 MG tablet Commonly known as: VITAMIN C Take 1 tablet (500 mg total) by mouth 2 (two) times daily.   ferrous sulfate 325 (65 FE) MG tablet Take 1 tablet (325 mg total) by mouth 2 (two) times daily with a meal.   traMADol 50 MG tablet Commonly known as: ULTRAM Take 1 tablet (50 mg total) by mouth every 6 (six) hours as needed for up to 5 days (postop pain not controlled with ibuprofen first).   Vitamin D3 125 MCG (5000 UT) Tbdp Take 5,000 Units by mouth daily at 2 PM.          Follow-up Information     Ileana Roup, MD Follow up in 2 week(s).   Specialties: General Surgery, Colon and Rectal Surgery Contact information: [place] [address] [phone]                  Signed: [place], [facility] 11/26/2021, 2:20 PM Please see Amion for pager number during day hours 7:00am-4:30pm

## 2021-11-28 LAB — SURGICAL PATHOLOGY

## 2021-12-11 ENCOUNTER — Other Ambulatory Visit: Payer: Self-pay

## 2021-12-11 NOTE — Progress Notes (Signed)
The proposed treatment discussed in conference is for discussion purpose only and is not a binding recommendation.  The patients have not been physically examined, or presented with their treatment options.  Therefore, final treatment plans cannot be decided.  

## 2021-12-17 ENCOUNTER — Ambulatory Visit
Admission: RE | Admit: 2021-12-17 | Discharge: 2021-12-17 | Disposition: A | Discharge status: Home / Self Care | Payer: Medicare HMO | Source: Ambulatory Visit | Attending: Family Medicine | Admitting: Family Medicine

## 2021-12-17 DIAGNOSIS — Z1231 Encounter for screening mammogram for malignant neoplasm of breast: Secondary | ICD-10-CM | POA: Diagnosis not present

## 2022-01-08 ENCOUNTER — Telehealth: Payer: Self-pay | Admitting: Genetic Counselor

## 2022-01-08 NOTE — Telephone Encounter (Signed)
Scheduled appt per 4/12 referral. Pt is aware of appt date and time. Pt is aware to arrive 15 mins prior to appt time and to bring and updated insurance card. Pt is aware of appt location.   ?

## 2022-02-17 ENCOUNTER — Inpatient Hospital Stay: Payer: PRIVATE HEALTH INSURANCE | Attending: Internal Medicine | Admitting: Licensed Clinical Social Worker

## 2022-02-17 ENCOUNTER — Encounter: Payer: Self-pay | Admitting: Licensed Clinical Social Worker

## 2022-02-17 ENCOUNTER — Inpatient Hospital Stay: Payer: PRIVATE HEALTH INSURANCE

## 2022-02-17 ENCOUNTER — Other Ambulatory Visit: Payer: Self-pay

## 2022-02-17 DIAGNOSIS — Z801 Family history of malignant neoplasm of trachea, bronchus and lung: Secondary | ICD-10-CM

## 2022-02-17 DIAGNOSIS — Z86010 Personal history of colonic polyps: Secondary | ICD-10-CM | POA: Diagnosis not present

## 2022-02-17 DIAGNOSIS — Z85038 Personal history of other malignant neoplasm of large intestine: Secondary | ICD-10-CM | POA: Diagnosis not present

## 2022-02-17 NOTE — Progress Notes (Signed)
REFERRING PROVIDER: Ileana Roup, MD [address] [place],  [place] [address]  PRIMARY PROVIDER:  Susy Frizzle, MD  PRIMARY REASON FOR VISIT:  1. History of colon cancer   2. History of colonic polyps   3. Family history of lung cancer      HISTORY OF PRESENT ILLNESS:   Ms. Monger, a 76 y.o. female, was seen for a  cancer genetics consultation at the request of Dr. Dema Severin due to a personal history of colon cancer and colon polyps.  Ms. Bergeman presents to clinic today to discuss the possibility of a hereditary predisposition to cancer, genetic testing, and to further clarify her future cancer risks, as well as potential cancer risks for family members.   In 2022, at the age of 59, Ms. Depaz had a colonoscopy that showed colon adenocarcinoma as well as 19 colon polyps. She had colon resection in 2023 that showed intact MMR and MSI-Stable.  CANCER HISTORY:  Oncology History   No history exists.     RISK FACTORS:  Menarche was at age 18.  First live birth at age 33.  OCP use for approximately  unk amount  years.  Ovaries intact: no.  Hysterectomy: yes.  Menopausal status: postmenopausal.  Mammogram within the last year: yes. Number of breast biopsies: 0.  Past Medical History:  Diagnosis Date   CKD (chronic kidney disease) stage 3, GFR 30-59 ml/min (HCC)    HLD (hyperlipidemia)    Osteoporosis     Past Surgical History:  Procedure Laterality Date   ABDOMINAL HYSTERECTOMY     for fibroids, [facility]   LAPAROSCOPIC RIGHT HEMI COLECTOMY Right 11/21/2021   Procedure: LAPAROSCOPIC RIGHT HEMI COLECTOMY, SMALL BOWEL RESECTION;  Surgeon: Ileana Roup, MD;  Location: [facility];  Service: General;  Laterality: Right;   TONSILLECTOMY      FAMILY HISTORY:  We obtained a detailed, 4-generation family history.  Significant diagnoses are listed below: Family History  Problem Relation Age of Onset   Hypertension Mother    Stroke  Mother    Diabetes Mother    Alcohol abuse Father    Hypertension Father    Kidney disease Sister    Heart disease Sister    Hypertension Sister    Diabetes Sister    Lung cancer Sister    Heart disease Brother    Heart disease Brother    Breast cancer Neg Hx    Colon cancer Neg Hx    Esophageal cancer Neg Hx    Pancreatic cancer Neg Hx    Stomach cancer Neg Hx    Ms. Folmar has 2 sons, 69 and 60, no history of cancer. She had 2 brothers and 1 sister. Her sister had lung cancer and passed at 60, she had history of smoking.  Ms. Ellena's mother died at 47, she did not have siblings. No known cancers for maternal grandparents.  Ms. Jeter's father died at 46. He had 20+ full/half siblings, limited information about them. No known cancers on this side of the family.  Ms. Paster is unaware of previous family history of genetic testing for hereditary cancer risks. There is no reported Ashkenazi Jewish ancestry. There is no known consanguinity.    GENETIC COUNSELING ASSESSMENT: Ms. Trevizo is a 76 y.o. female with a personal history of colon cancer and colon polyps which is somewhat suggestive of a hereditary cancer syndrome and predisposition to cancer. We, therefore, discussed and recommended the following at today's visit.  DISCUSSION: We discussed that polyps in general are common, however, most people have fewer than 5 lifetime polyps.  When an individual has 10 or more polyps we become concerned about an underlying polyposis syndrome.  The most common hereditary polyposis syndromes are caused by problems in the APC and MUTYH genes. We discussed that testing is beneficial for several reasons including knowing how to follow individuals for cancer screenings, and understand if other family members could be at risk for cancer and allow them to undergo genetic testing.   We reviewed the characteristics, features and inheritance patterns of hereditary cancer syndromes. We also  discussed genetic testing, including the appropriate family members to test, the process of testing, insurance coverage and turn-around-time for results. We discussed the implications of a negative, positive and/or variant of uncertain significant result. We recommended Ms. Winning pursue genetic testing for the [facility] CancerNext-Expanded+RNA gene panel.   Based on Ms. Epstein's personal history of 19 colon polyps, she meets medical criteria for genetic testing. Despite that she meets criteria, she may still have an out of pocket cost. We discussed that if her out of pocket cost for testing is over $100, the laboratory will call and confirm whether she wants to proceed with testing.  If the out of pocket cost of testing is less than $100 she will be billed by the genetic testing laboratory.   PLAN: After considering the risks, benefits, and limitations, Ms. Wickes did not wish to pursue genetic testing at today's visit. We understand this decision and remain available to coordinate genetic testing at any time in the future. We, therefore, recommend Ms. Tello continue to follow the cancer screening guidelines given by her primary healthcare provider.  Ms. Dillon's questions were answered to her satisfaction today. Our contact information was provided should additional questions or concerns arise. Thank you for the referral and allowing [place] to share in the care of your patient.   Faith Rogue, MS, [facility] Genetic Counselor [email or web address] Phone: [phone]  The patient was seen for a total of 25 minutes in face-to-face genetic counseling.  Patient was seen with her granddaughter, Andee Poles. Dr. Grayland Ormond was available for discussion regarding this case.   _______________________________________________________________________ For Office Staff:  Number of people involved in session: 2 Was an Intern/ student involved with case: no

## 2022-06-23 DIAGNOSIS — H524 Presbyopia: Secondary | ICD-10-CM | POA: Diagnosis not present

## 2022-08-14 ENCOUNTER — Telehealth: Payer: Self-pay | Admitting: Family Medicine

## 2022-08-14 NOTE — Telephone Encounter (Signed)
Left message for patient to call back and schedule Medicare Annual Wellness Visit (AWV) either virtually or in office. Left  my Herbie Drape number [phone]   Last AWV   please schedule with [place]   45 min for awv-i and in office appointments 30 min for awv-s  phone/virtual appointments

## 2022-09-09 ENCOUNTER — Ambulatory Visit (INDEPENDENT_AMBULATORY_CARE_PROVIDER_SITE_OTHER): Payer: PRIVATE HEALTH INSURANCE

## 2022-09-09 VITALS — Ht 66.0 in | Wt 140.0 lb

## 2022-09-09 DIAGNOSIS — Z0000 Encounter for general adult medical examination without abnormal findings: Secondary | ICD-10-CM

## 2022-09-09 NOTE — Progress Notes (Signed)
Subjective:   Amy Powell is a 76 y.o. female who presents for Medicare Annual (Subsequent) preventive examination.  I connected with  Jene Every on 09/09/22 by a audio enabled telemedicine application and verified that I am speaking with the correct person using two identifiers.  Patient Location: Home  Provider Location: Office/Clinic  I discussed the limitations of evaluation and management by telemedicine. The patient expressed understanding and agreed to proceed.  Review of Systems     Cardiac Risk Factors include: advanced age (>26mn, >>73women)     Objective:    Today's Vitals   09/09/22 1210  Weight: 140 lb (63.5 kg)  Height: '5\' 6"'$  (1.676 m)   Body mass index is 22.6 kg/m.     09/09/2022    1:20 PM 11/21/2021   11:14 AM 07/19/2021   12:52 AM 03/28/2021    8:30 AM 09/23/2018   10:16 AM  Advanced Directives  Does Patient Have a Medical Advance Directive? No No No No No  Does patient want to make changes to medical advance directive?    No - Patient declined   Would patient like information on creating a medical advance directive? Yes (MAU/Ambulatory/Procedural Areas - Information given) No - Patient declined   Yes (MAU/Ambulatory/Procedural Areas - Information given)    Current Medications (verified) Outpatient Encounter Medications as of 09/09/2022  Medication Sig   Cholecalciferol (VITAMIN D3) 125 MCG (5000 UT) TBDP Take 5,000 Units by mouth daily at 2 PM.   ferrous sulfate 325 (65 FE) MG tablet Take 1 tablet (325 mg total) by mouth 2 (two) times daily with a meal.   No facility-administered encounter medications on file as of 09/09/2022.    Allergies (verified) Penicillins   History: Past Medical History:  Diagnosis Date   CKD (chronic kidney disease) stage 3, GFR 30-59 ml/min (HCC)    HLD (hyperlipidemia)    Osteoporosis    Past Surgical History:  Procedure Laterality Date   ABDOMINAL HYSTERECTOMY     for fibroids, [facility]BS[facility]    LAPAROSCOPIC RIGHT HEMI COLECTOMY Right 11/21/2021   Procedure: LAPAROSCOPIC RIGHT HEMI COLECTOMY, SMALL BOWEL RESECTION;  Surgeon: WIleana Roup MD;  Location: [facility];  Service: General;  Laterality: Right;   TONSILLECTOMY     Family History  Problem Relation Age of Onset   Hypertension Mother    Stroke Mother    Diabetes Mother    Alcohol abuse Father    Hypertension Father    Kidney disease Sister    Heart disease Sister    Hypertension Sister    Diabetes Sister    Lung cancer Sister    Heart disease Brother    Heart disease Brother    Breast cancer Neg Hx    Colon cancer Neg Hx    Esophageal cancer Neg Hx    Pancreatic cancer Neg Hx    Stomach cancer Neg Hx    Social History   Socioeconomic History   Marital status: Single    Spouse name: Not on file   Number of children: Not on file   Years of education: Not on file   Highest education level: Not on file  Occupational History   Not on file  Tobacco Use   Smoking status: Never   Smokeless tobacco: Never  Vaping Use   Vaping Use: Never used  Substance and Sexual Activity   Alcohol use: Yes    Comment: rarely   Drug use: No   Sexual activity: Not on  file    Comment: Works at [employer].  Other Topics Concern   Not on file  Social History Narrative   Not on file   Social Determinants of Health   Financial Resource Strain: Low Risk  (09/09/2022)   Overall Financial Resource Strain (CARDIA)    Difficulty of Paying Living Expenses: Not hard at all  Food Insecurity: No Food Insecurity (09/09/2022)   Hunger Vital Sign    Worried About Running Out of Food in the Last Year: Never true    Ran Out of Food in the Last Year: Never true  Transportation Needs: No Transportation Needs (09/09/2022)   PRAPARE - Hydrologist (Medical): No    Lack of Transportation (Non-Medical): No  Physical Activity: Sufficiently Active (09/09/2022)   Exercise Vital Sign    Days of Exercise per Week:  7 days    Minutes of Exercise per Session: 30 min  Stress: No Stress Concern Present (09/09/2022)   [place]    Feeling of Stress : Not at all  Social Connections: Moderately Integrated (09/09/2022)   Social Connection and Isolation Panel [NHANES]    Frequency of Communication with Friends and Family: More than three times a week    Frequency of Social Gatherings with Friends and Family: Three times a week    Attends Religious Services: More than 4 times per year    Active Member of Clubs or Organizations: Yes    Attends Music therapist: More than 4 times per year    Marital Status: Never married    Tobacco Counseling Counseling given: Not Answered   Clinical Intake:  Pre-visit preparation completed: Yes  Pain : No/denies pain  Diabetes: No  How often do you need to have someone help you when you read instructions, pamphlets, or other written materials from your doctor or pharmacy?: 1 - Never  Diabetic?No   Interpreter Needed?: No  Information entered by :: Denman George LPN   Activities of Daily Living    09/09/2022    1:21 PM 09/05/2022    7:49 PM  In your present state of health, do you have any difficulty performing the following activities:  Hearing? 0 1  Vision? 0 0  Difficulty concentrating or making decisions? 0 1  Walking or climbing stairs? 0 0  Dressing or bathing? 0 0  Doing errands, shopping? 0 0  Preparing Food and eating ? N N  Using the Toilet? N N  In the past six months, have you accidently leaked urine? N N  Do you have problems with loss of bowel control? N N  Managing your Medications? N N  Managing your Finances? N N  Housekeeping or managing your Housekeeping? N N    Patient Care Team: Susy Frizzle, MD as PCP - General (Family Medicine)  Indicate any recent Medical Services you may have received from other than [facility] providers in the past year (date  may be approximate).     Assessment:   This is a routine wellness examination for [facility].  Hearing/Vision screen Hearing Screening - Comments:: No concerns noted   Vision Screening - Comments:: Wears rx glasses - up to date with routine eye exams   Dietary issues and exercise activities discussed: Current Exercise Habits: Home exercise routine, Type of exercise: calisthenics, Time (Minutes): 30, Frequency (Times/Week): 7, Weekly Exercise (Minutes/Week): 210, Intensity: Moderate   Goals Addressed  This Visit's Progress    Prevent falls   On track     Depression Screen    09/09/2022    1:19 PM 03/28/2021    8:33 AM 01/18/2021   11:44 AM 10/24/2019   12:31 PM 10/24/2019   12:11 PM 09/23/2018   10:13 AM  PHQ 2/9 Scores  PHQ - 2 Score 0 0 0 0 0 0  PHQ- 9 Score    0      Fall Risk    09/09/2022    1:08 PM 09/05/2022    7:49 PM 03/28/2021    8:32 AM 01/18/2021   11:44 AM 10/24/2019   12:32 PM  [place] in the past year? 0 0 0 0 0  Number falls in past yr: 0 0 0    Injury with Fall? 0 0 0    Risk for fall due to : No Fall Risks  No Fall Risks No Fall Risks   Follow up Falls evaluation completed;Education provided;Falls prevention discussed  Falls evaluation completed;Falls prevention discussed Falls evaluation completed     FALL RISK PREVENTION PERTAINING TO THE HOME:  Any stairs in or around the home? No  If so, are there any without handrails? No  Home free of loose throw rugs in walkways, pet beds, electrical cords, etc? Yes  Adequate lighting in your home to reduce risk of falls? Yes   ASSISTIVE DEVICES UTILIZED TO PREVENT FALLS:  Life alert? No  Use of a cane, walker or w/c? No  Grab bars in the bathroom? Yes  Shower chair or bench in shower? No  Elevated toilet seat or a handicapped toilet? Yes   TIMED UP AND GO:  Was the test performed? No .  Length of time to ambulate 10 feet: telephonic visit    Cognitive Function:         09/09/2022    1:21 PM 03/28/2021    8:35 AM  6CIT Screen  What Year? 0 points 0 points  What month? 0 points 0 points  What time? 0 points 0 points  Count back from 20 0 points 0 points  Months in reverse 0 points 0 points  Repeat phrase 0 points 4 points  Total Score 0 points 4 points    Immunizations Immunization History  Administered Date(s) Administered   Janssen (J&J) SARS-COV-2 Vaccination 01/09/2020   Moderna Sars-Covid-2 Vaccination 08/28/2020   Pneumococcal Polysaccharide-23 10/24/2019    TDAP status: Due, Education has been provided regarding the importance of this vaccine. Advised may receive this vaccine at local pharmacy or Health Dept. Aware to provide a copy of the vaccination record if obtained from local pharmacy or Health Dept. Verbalized acceptance and understanding.  Flu Vaccine status: Declined, Education has been provided regarding the importance of this vaccine but patient still declined. Advised may receive this vaccine at local pharmacy or Health Dept. Aware to provide a copy of the vaccination record if obtained from local pharmacy or Health Dept. Verbalized acceptance and understanding.  Pneumococcal vaccine status: Declined,  Education has been provided regarding the importance of this vaccine but patient still declined. Advised may receive this vaccine at local pharmacy or Health Dept. Aware to provide a copy of the vaccination record if obtained from local pharmacy or Health Dept. Verbalized acceptance and understanding.   Covid-19 vaccine status: Declined, Education has been provided regarding the importance of this vaccine but patient still declined. Advised may receive this vaccine at local pharmacy or Health Dept.or vaccine  clinic. Aware to provide a copy of the vaccination record if obtained from local pharmacy or Health Dept. Verbalized acceptance and understanding.  Qualifies for Shingles Vaccine? Yes   Zostavax completed No   Shingrix Completed?:  No.    Education has been provided regarding the importance of this vaccine. Patient has been advised to call insurance company to determine out of pocket expense if they have not yet received this vaccine. Advised may also receive vaccine at local pharmacy or Health Dept. Verbalized acceptance and understanding.  Screening Tests Health Maintenance  Topic Date Due   Hepatitis C Screening  Never done   DTaP/Tdap/Td (1 - Tdap) Never done   Zoster Vaccines- Shingrix (1 of 2) Never done   Pneumonia Vaccine 67+ Years old (2 - PCV) 10/23/2020   INFLUENZA VACCINE  Never done   COVID-19 Vaccine (3 - 2023-24 season) 05/30/2022   Medicare Annual Wellness (AWV)  09/10/2023   DEXA SCAN  Completed   HPV VACCINES  Aged Out   Fecal DNA (Cologuard)  Discontinued    Health Maintenance  Health Maintenance Due  Topic Date Due   Hepatitis C Screening  Never done   DTaP/Tdap/Td (1 - Tdap) Never done   Zoster Vaccines- Shingrix (1 of 2) Never done   Pneumonia Vaccine 83+ Years old (2 - PCV) 10/23/2020   INFLUENZA VACCINE  Never done   COVID-19 Vaccine (3 - 2023-24 season) 05/30/2022    Colorectal cancer screening: No longer required.   Mammogram status: No longer required due to age.  Bone Density status: Completed 04/14/19. Results reflect: Bone density results: NORMAL. Repeat every 7 years.  Lung Cancer Screening: (Low Dose CT Chest recommended if Age 55-80 years, 30 pack-year currently smoking OR have quit w/in 15years.) does not qualify.   Lung Cancer Screening Referral: n/a  Additional Screening:  Hepatitis C Screening: does not qualify  Vision Screening: Recommended annual ophthalmology exams for early detection of glaucoma and other disorders of the eye. Is the patient up to date with their annual eye exam?  Yes  Who is the provider or what is the name of the office in which the patient attends annual eye exams? Unable to provide name  If pt is not established with a provider, would  they like to be referred to a provider to establish care? No .   Dental Screening: Recommended annual dental exams for proper oral hygiene  Community Resource Referral / Chronic Care Management: CRR required this visit?  No   CCM required this visit?  No      Plan:     I have personally reviewed and noted the following in the patient's chart:   Medical and social history Use of alcohol, tobacco or illicit drugs  Current medications and supplements including opioid prescriptions. Patient is not currently taking opioid prescriptions. Functional ability and status Nutritional status Physical activity Advanced directives List of other physicians Hospitalizations, surgeries, and ER visits in previous 12 months Vitals Screenings to include cognitive, depression, and falls Referrals and appointments  In addition, I have reviewed and discussed with patient certain preventive protocols, quality metrics, and best practice recommendations. A written personalized care plan for preventive services as well as general preventive health recommendations were provided to patient.     Vanetta Mulders, [place]   07/23/8526   Due to this being a virtual visit, the after visit summary with patients personalized plan was offered to patient via mail or my-chart. Patient would like to access on  my-chart  Nurse Notes: No concerns

## 2022-10-21 ENCOUNTER — Other Ambulatory Visit: Payer: Self-pay | Admitting: Family Medicine

## 2022-10-21 ENCOUNTER — Encounter: Payer: Self-pay | Admitting: Family Medicine

## 2022-10-21 ENCOUNTER — Ambulatory Visit (INDEPENDENT_AMBULATORY_CARE_PROVIDER_SITE_OTHER): Payer: Medicare HMO | Admitting: Family Medicine

## 2022-10-21 VITALS — BP 110/62 | HR 53 | Temp 98.0°F | Ht 66.0 in | Wt 135.0 lb

## 2022-10-21 DIAGNOSIS — Z1231 Encounter for screening mammogram for malignant neoplasm of breast: Secondary | ICD-10-CM

## 2022-10-21 DIAGNOSIS — M542 Cervicalgia: Secondary | ICD-10-CM

## 2022-10-21 DIAGNOSIS — C180 Malignant neoplasm of cecum: Secondary | ICD-10-CM | POA: Diagnosis not present

## 2022-10-21 MED ORDER — LINACLOTIDE 145 MCG PO CAPS: 145.0000 ug | ORAL_CAPSULE | Freq: Every day | ORAL | 11 refills | Status: AC

## 2022-10-21 NOTE — Progress Notes (Signed)
Subjective:    Patient ID: Amy Powell, female    DOB: 01/15/1946, 77 y.o.   MRN: [id number]  HPI  Rozetta Stumpp is a 77yo female with a h/o cecal adenocarcinoma who was admitted to [facility] and underwent Laparoscopic right hemicolectomy, small bowel resection, bilateral transversus abdominus plane (TAP) blocks on 11/21/21. I do not see any recent follow up with GI or oncology in the last 12 months. Review of CT of chest abd and pelvis 12/22 revealed no evidence of metastases.  Patient states she missed her appointment in September with her gastroenterologist.  She does complain of constipation but otherwise is doing well.  She denies any abdominal pain.  She denies any blood in her stool.  She denies any melena.  She denies any nausea or vomiting.  She does complain about pain on the left side of her neck.  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  This is in the anterior portion of her neck.  The pain is on the left posterior side of her neck.  The left trapezius is tender to palpation.  She states that has been sore for over a year Past Medical History:  Diagnosis Date   CKD (chronic kidney disease) stage 3, GFR 30-59 ml/min (HCC)    HLD (hyperlipidemia)    Osteoporosis    Past Surgical History:  Procedure Laterality Date   ABDOMINAL HYSTERECTOMY     for fibroids, [facility]BSO   LAPAROSCOPIC RIGHT HEMI COLECTOMY Right 11/21/2021   Procedure: LAPAROSCOPIC RIGHT HEMI COLECTOMY, SMALL BOWEL RESECTION;  Surgeon: Ileana Roup, MD;  Location: [facility];  Service: General;  Laterality: Right;   TONSILLECTOMY      Current Outpatient Medications on File Prior to Visit  Medication Sig Dispense Refill   Cholecalciferol (VITAMIN D3) 125 MCG (5000 UT) TBDP Take 5,000 Units by mouth daily at 2 PM.     ferrous sulfate 325 (65 FE) MG tablet Take 1 tablet (325 mg total) by mouth 2 (two) times daily with a meal. 60 tablet 0   No current  facility-administered medications on file prior to visit.   Allergies  Allergen Reactions   Penicillins Itching    Has patient had a PCN reaction causing immediate rash, facial/tongue/throat swelling, SOB or lightheadedness with hypotension: Y Has patient had a PCN reaction causing severe rash involving mucus membranes or skin necrosis: Y Has patient had a PCN reaction that required hospitalization: N Has patient had a PCN reaction occurring within the last 10 years: N If all of the above answers are "NO", then may proceed with Cephalosporin use.    Social History   Socioeconomic History   Marital status: Single    Spouse name: Not on file   Number of children: Not on file   Years of education: Not on file   Highest education level: Not on file  Occupational History   Not on file  Tobacco Use   Smoking status: Never   Smokeless tobacco: Never  Vaping Use   Vaping Use: Never used  Substance and Sexual Activity   Alcohol use: Yes    Comment: rarely   Drug use: No   Sexual activity: Not on file    Comment: Works at [employer].  Other Topics Concern   Not on file  Social History Narrative   Not on file   Social Determinants of Health   Financial Resource Strain: Low Risk  (  09/09/2022)   Overall Financial Resource Strain (CARDIA)    Difficulty of Paying Living Expenses: Not hard at all  Food Insecurity: No Food Insecurity (09/09/2022)   Hunger Vital Sign    Worried About Running Out of Food in the Last Year: Never true    Ran Out of Food in the Last Year: Never true  Transportation Needs: No Transportation Needs (09/09/2022)   PRAPARE - Hydrologist (Medical): No    Lack of Transportation (Non-Medical): No  Physical Activity: Sufficiently Active (09/09/2022)   Exercise Vital Sign    Days of Exercise per Week: 7 days    Minutes of Exercise per Session: 30 min  Stress: No Stress Concern Present (09/09/2022)   [place]    Feeling of Stress : Not at all  Social Connections: Moderately Integrated (09/09/2022)   Social Connection and Isolation Panel [NHANES]    Frequency of Communication with Friends and Family: More than three times a week    Frequency of Social Gatherings with Friends and Family: Three times a week    Attends Religious Services: More than 4 times per year    Active Member of Clubs or Organizations: Yes    Attends Archivist Meetings: More than 4 times per year    Marital Status: Never married  Intimate Partner Violence: Not At Risk (09/09/2022)   Humiliation, Afraid, Rape, and Kick questionnaire    Fear of Current or Ex-Partner: No    Emotionally Abused: No    Physically Abused: No    Sexually Abused: No    Family History  Problem Relation Age of Onset   Hypertension Mother    Stroke Mother    Diabetes Mother    Alcohol abuse Father    Hypertension Father    Kidney disease Sister    Heart disease Sister    Hypertension Sister    Diabetes Sister    Lung cancer Sister    Heart disease Brother    Heart disease Brother    Breast cancer Neg Hx    Colon cancer Neg Hx    Esophageal cancer Neg Hx    Pancreatic cancer Neg Hx    Stomach cancer Neg Hx     Review of Systems  All other systems reviewed and are negative.      Objective:   Physical Exam Vitals reviewed.  Constitutional:      General: She is not in acute distress.    Appearance: She is well-developed. She is not diaphoretic.  HENT:     Head: Normocephalic and atraumatic.  Neck:     Thyroid: No thyromegaly.     Vascular: No JVD.     Trachea: No tracheal deviation.   Cardiovascular:     Rate and Rhythm: Normal rate and regular rhythm.     Heart sounds: Normal heart sounds. No murmur heard.    No friction rub. No gallop.  Pulmonary:     Effort: Pulmonary effort is normal. No respiratory distress.     Breath sounds: Normal breath sounds. No  stridor. No wheezing or rales.  Chest:     Chest wall: No tenderness.  Abdominal:     General: Bowel sounds are normal. There is no distension.     Palpations: Abdomen is soft. There is no mass.     Tenderness: There is no abdominal tenderness. There is no guarding or rebound.  Neurological:  Mental Status: She is alert.     Motor: No abnormal muscle tone.     Deep Tendon Reflexes: Reflexes are normal and symmetric.  Psychiatric:        Judgment: Judgment normal.        Assessment & Plan:   Adenocarcinoma of cecum ([place]) - Plan: CBC with Differential/Platelet, COMPLETE METABOLIC PANEL WITH GFR, CEA  Neck pain - Plan: DG Cervical Spine Complete Recommended follow-up with GI.  Check CBC CMP and CEA level.  Consult GI to arrange follow-up colonoscopy.  Believe the pain in her neck is likely arthritic.  Start by obtaining an x-ray of the cervical spine for further evaluation

## 2022-10-22 ENCOUNTER — Other Ambulatory Visit: Payer: Self-pay

## 2022-10-22 ENCOUNTER — Telehealth: Payer: Self-pay

## 2022-10-22 DIAGNOSIS — E875 Hyperkalemia: Secondary | ICD-10-CM

## 2022-10-22 LAB — COMPLETE METABOLIC PANEL WITH GFR
AG Ratio: 1.5 (calc) (ref 1.0–2.5)
ALT: 8 U/L (ref 6–29)
AST: 14 U/L (ref 10–35)
Albumin: 4.1 g/dL (ref 3.6–5.1)
Alkaline phosphatase (APISO): 66 U/L (ref 37–153)
BUN/Creatinine Ratio: 23 (calc) — ABNORMAL HIGH (ref 6–22)
BUN: 28 mg/dL — ABNORMAL HIGH (ref 7–25)
CO2: 27 mmol/L (ref 20–32)
Calcium: 9.7 mg/dL (ref 8.6–10.4)
Chloride: 110 mmol/L (ref 98–110)
Creat: 1.21 mg/dL — ABNORMAL HIGH (ref 0.60–1.00)
Globulin: 2.8 g/dL (calc) (ref 1.9–3.7)
Glucose, Bld: 83 mg/dL (ref 65–99)
Potassium: 6.4 mmol/L (ref 3.5–5.3)
Sodium: 145 mmol/L (ref 135–146)
Total Bilirubin: 0.7 mg/dL (ref 0.2–1.2)
Total Protein: 6.9 g/dL (ref 6.1–8.1)
eGFR: 46 mL/min/{1.73_m2} — ABNORMAL LOW (ref >=60)

## 2022-10-22 LAB — CBC WITH DIFFERENTIAL/PLATELET
Absolute Monocytes: 602 cells/uL (ref 200–950)
Basophils Absolute: 51 cells/uL (ref 0–200)
Basophils Relative: 0.8 %
Eosinophils Absolute: 109 cells/uL (ref 15–500)
Eosinophils Relative: 1.7 %
HCT: 42.2 % (ref 35.0–45.0)
Hemoglobin: 14.2 g/dL (ref 11.7–15.5)
Lymphs Abs: 1555 cells/uL (ref 850–3900)
MCH: 27.3 pg (ref 27.0–33.0)
MCHC: 33.6 g/dL (ref 32.0–36.0)
MCV: 81.2 fL (ref 80.0–100.0)
MPV: 9.4 fL (ref 7.5–12.5)
Monocytes Relative: 9.4 %
Neutro Abs: 4083 cells/uL (ref 1500–7800)
Neutrophils Relative %: 63.8 %
Platelets: 184 10*3/uL (ref 140–400)
RBC: 5.2 10*6/uL — ABNORMAL HIGH (ref 3.80–5.10)
RDW: 13.8 % (ref 11.0–15.0)
Total Lymphocyte: 24.3 %
WBC: 6.4 10*3/uL (ref 3.8–10.8)

## 2022-10-22 LAB — CEA: CEA: <2 ng/mL

## 2022-10-22 MED ORDER — LOKELMA 10 G PO PACK: 10.0000 g | PACK | Freq: Two times a day (BID) | ORAL | 0 refills | Status: AC

## 2022-10-22 NOTE — Telephone Encounter (Signed)
Critical lab result, potassium 6.4. Dr. Dennard Schaumann advised and ordered Lokelma 10 mg po BID x 2 doses. Labs in AM to run stat and recheck patient on Friday.     LM on all numbers in patient's chart. Also sent My Chart message, 10/22/2022 @ 11:48 am.

## 2022-10-22 NOTE — Telephone Encounter (Signed)
Critical lab result, potassium 6.4. Dr. Dennard Schaumann advised and ordered Lokelma 10 mg po BID x 2 doses. Labs in AM to run stat and recheck patient on Friday.

## 2022-10-22 NOTE — Telephone Encounter (Signed)
Pt called and stated that the [facility] is too high. Geetika Jane/LPN aware and will msg Dr. Dennard Schaumann due to there is no alternative will call pt.

## 2022-10-23 ENCOUNTER — Other Ambulatory Visit: Payer: PRIVATE HEALTH INSURANCE

## 2022-10-23 DIAGNOSIS — E875 Hyperkalemia: Secondary | ICD-10-CM | POA: Diagnosis not present

## 2022-10-23 DIAGNOSIS — N183 Chronic kidney disease, stage 3 unspecified: Secondary | ICD-10-CM | POA: Diagnosis not present

## 2022-10-23 LAB — COMPREHENSIVE METABOLIC PANEL
AG Ratio: 1.5 (calc) (ref 1.0–2.5)
ALT: 9 U/L (ref 6–29)
AST: 13 U/L (ref 10–35)
Albumin: 4.2 g/dL (ref 3.6–5.1)
Alkaline phosphatase (APISO): 66 U/L (ref 37–153)
BUN/Creatinine Ratio: 16 (calc) (ref 6–22)
BUN: 21 mg/dL (ref 7–25)
CO2: 31 mmol/L (ref 20–32)
Calcium: 9.4 mg/dL (ref 8.6–10.4)
Chloride: 104 mmol/L (ref 98–110)
Creat: 1.28 mg/dL — ABNORMAL HIGH (ref 0.60–1.00)
Globulin: 2.8 g/dL (calc) (ref 1.9–3.7)
Glucose, Bld: 95 mg/dL (ref 65–99)
Potassium: 4.5 mmol/L (ref 3.5–5.3)
Sodium: 141 mmol/L (ref 135–146)
Total Bilirubin: 1.3 mg/dL — ABNORMAL HIGH (ref 0.2–1.2)
Total Protein: 7 g/dL (ref 6.1–8.1)

## 2022-10-23 NOTE — Telephone Encounter (Signed)
DUPLICATE ENCOUNTER. SEE OTHER TE FROM 10/23/2022.

## 2022-10-24 ENCOUNTER — Encounter: Payer: Self-pay | Admitting: Family Medicine

## 2022-10-24 ENCOUNTER — Ambulatory Visit (INDEPENDENT_AMBULATORY_CARE_PROVIDER_SITE_OTHER): Payer: Medicare HMO | Admitting: Family Medicine

## 2022-10-24 VITALS — BP 136/86 | HR 78 | Temp 97.7°F | Ht 66.0 in | Wt 134.0 lb

## 2022-10-24 DIAGNOSIS — E875 Hyperkalemia: Secondary | ICD-10-CM | POA: Diagnosis not present

## 2022-10-24 NOTE — Progress Notes (Signed)
Subjective:    Patient ID: Amy Powell, female    DOB: 09-03-1946, 77 y.o.   MRN: [id number]  HPI  Amy Powell is a 77yo female with a h/o cecal adenocarcinoma who was admitted to [facility] and underwent Laparoscopic right hemicolectomy, small bowel resection, bilateral transversus abdominus plane (TAP) blocks on 11/21/21. I do not see any recent follow up with GI or oncology in the last 12 months. Review of CT of chest abd and pelvis 12/22 revealed no evidence of metastases.  I recently saw the patient for a checkup.  As part of her checkup I did fasting lab work which showed a potassium of 6.4.  The patient took 2 doses of Lokelma and her repeat potassium was 4.5 Lab on 10/23/2022  Component Date Value Ref Range Status   Glucose, Bld 10/23/2022 95  65 - 99 mg/dL Final   Comment: .            Fasting reference interval .    BUN 10/23/2022 21  7 - 25 mg/dL Final   Creat 10/23/2022 1.28 (H)  0.60 - 1.00 mg/dL Final   BUN/Creatinine Ratio 10/23/2022 16  6 - 22 (calc) Final   Sodium 10/23/2022 141  135 - 146 mmol/L Final   Potassium 10/23/2022 4.5  3.5 - 5.3 mmol/L Final   Chloride 10/23/2022 104  98 - 110 mmol/L Final   CO2 10/23/2022 31  20 - 32 mmol/L Final   Calcium 10/23/2022 9.4  8.6 - 10.4 mg/dL Final   Total Protein 10/23/2022 7.0  6.1 - 8.1 g/dL Final   Albumin 10/23/2022 4.2  3.6 - 5.1 g/dL Final   Globulin 10/23/2022 2.8  1.9 - 3.7 g/dL (calc) Final   AG Ratio 10/23/2022 1.5  1.0 - 2.5 (calc) Final   Total Bilirubin 10/23/2022 1.3 (H)  0.2 - 1.2 mg/dL Final   Alkaline phosphatase (APISO) 10/23/2022 66  37 - 153 U/L Final   AST 10/23/2022 13  10 - 35 U/L Final   ALT 10/23/2022 9  6 - 29 U/L Final  Office Visit on 10/21/2022  Component Date Value Ref Range Status   WBC 10/21/2022 6.4  3.8 - 10.8 Thousand/uL Final   RBC 10/21/2022 5.20 (H)  3.80 - 5.10 Million/uL Final   Hemoglobin 10/21/2022 14.2  11.7 - 15.5 g/dL Final   HCT 10/21/2022 42.2  35.0 - 45.0 % Final   MCV  10/21/2022 81.2  80.0 - 100.0 fL Final   MCH 10/21/2022 27.3  27.0 - 33.0 pg Final   MCHC 10/21/2022 33.6  32.0 - 36.0 g/dL Final   RDW 10/21/2022 13.8  11.0 - 15.0 % Final   Platelets 10/21/2022 184  140 - 400 Thousand/uL Final   MPV 10/21/2022 9.4  7.5 - 12.5 fL Final   Neutro Abs 10/21/2022 4,083  1,500 - 7,800 cells/uL Final   Lymphs Abs 10/21/2022 1,555  850 - 3,900 cells/uL Final   Absolute Monocytes 10/21/2022 602  200 - 950 cells/uL Final   Eosinophils Absolute 10/21/2022 109  15 - 500 cells/uL Final   Basophils Absolute 10/21/2022 51  0 - 200 cells/uL Final   Neutrophils Relative % 10/21/2022 63.8  % Final   Total Lymphocyte 10/21/2022 24.3  % Final   Monocytes Relative 10/21/2022 9.4  % Final   Eosinophils Relative 10/21/2022 1.7  % Final   Basophils Relative 10/21/2022 0.8  % Final   Glucose, Bld 10/21/2022 83  65 - 99 mg/dL Final  Comment: .            Fasting reference interval .    BUN 10/21/2022 28 (H)  7 - 25 mg/dL Final   Creat 10/21/2022 1.21 (H)  0.60 - 1.00 mg/dL Final   eGFR 10/21/2022 46 (L)  > OR = 60 mL/min/1.28m Final   BUN/Creatinine Ratio 10/21/2022 23 (H)  6 - 22 (calc) Final   Sodium 10/21/2022 145  135 - 146 mmol/L Final   Potassium 10/21/2022 6.4 (HH)  3.5 - 5.3 mmol/L Final   Comment: Verified by repeat analysis. .    Chloride 10/21/2022 110  98 - 110 mmol/L Final   CO2 10/21/2022 27  20 - 32 mmol/L Final   Calcium 10/21/2022 9.7  8.6 - 10.4 mg/dL Final   Total Protein 10/21/2022 6.9  6.1 - 8.1 g/dL Final   Albumin 10/21/2022 4.1  3.6 - 5.1 g/dL Final   Globulin 10/21/2022 2.8  1.9 - 3.7 g/dL (calc) Final   AG Ratio 10/21/2022 1.5  1.0 - 2.5 (calc) Final   Total Bilirubin 10/21/2022 0.7  0.2 - 1.2 mg/dL Final   Alkaline phosphatase (APISO) 10/21/2022 66  37 - 153 U/L Final   AST 10/21/2022 14  10 - 35 U/L Final   ALT 10/21/2022 8  6 - 29 U/L Final   CEA 10/21/2022 <2.0  ng/mL Final   Comment: Non-Smoker: <2.5 Smoker:     <5.0 . . This  test was performed using the Siemens  chemiluminescent method. Values obtained from different assay methods cannot be used interchangeably. CEA levels, regardless of value, should not be interpreted as absolute evidence of the presence or absence of disease. .   Patient admits to eating a lot of potassium rich foods such as grapes, bananas, potatoes.  We are here today to discuss what we need to do to help prevent hyperkalemia in the future.  Past Medical History:  Diagnosis Date   CKD (chronic kidney disease) stage 3, GFR 30-59 ml/min (HCC)    HLD (hyperlipidemia)    Osteoporosis    Past Surgical History:  Procedure Laterality Date   ABDOMINAL HYSTERECTOMY     for fibroids, TAHBSO   LAPAROSCOPIC RIGHT HEMI COLECTOMY Right 11/21/2021   Procedure: LAPAROSCOPIC RIGHT HEMI COLECTOMY, SMALL BOWEL RESECTION;  Surgeon: WIleana Roup MD;  Location: [facility];  Service: General;  Laterality: Right;   TONSILLECTOMY      Current Outpatient Medications on File Prior to Visit  Medication Sig Dispense Refill   Cholecalciferol (VITAMIN D3) 125 MCG (5000 UT) TBDP Take 5,000 Units by mouth daily at 2 PM.     linaclotide (LINZESS) 145 MCG CAPS capsule Take 1 capsule (145 mcg total) by mouth daily before breakfast. 30 capsule 11   sodium zirconium cyclosilicate (LOKELMA) 10 g PACK packet Take 10 g by mouth 2 (two) times daily for 2 days. 4 packet 0   No current facility-administered medications on file prior to visit.   Allergies  Allergen Reactions   Penicillins Itching    Has patient had a PCN reaction causing immediate rash, facial/tongue/throat swelling, SOB or lightheadedness with hypotension: Y Has patient had a PCN reaction causing severe rash involving mucus membranes or skin necrosis: Y Has patient had a PCN reaction that required hospitalization: N Has patient had a PCN reaction occurring within the last 10 years: N If all of the above answers are "NO", then may proceed with  Cephalosporin use.    Social History   Socioeconomic  History   Marital status: Single    Spouse name: Not on file   Number of children: Not on file   Years of education: Not on file   Highest education level: Not on file  Occupational History   Not on file  Tobacco Use   Smoking status: Never   Smokeless tobacco: Never  Vaping Use   Vaping Use: Never used  Substance and Sexual Activity   Alcohol use: Yes    Comment: rarely   Drug use: No   Sexual activity: Not on file    Comment: Works at [employer].  Other Topics Concern   Not on file  Social History Narrative   Not on file   Social Determinants of Health   Financial Resource Strain: Low Risk  (09/09/2022)   Overall Financial Resource Strain (CARDIA)    Difficulty of Paying Living Expenses: Not hard at all  Food Insecurity: No Food Insecurity (09/09/2022)   Hunger Vital Sign    Worried About Running Out of Food in the Last Year: Never true    Ran Out of Food in the Last Year: Never true  Transportation Needs: No Transportation Needs (09/09/2022)   PRAPARE - Hydrologist (Medical): No    Lack of Transportation (Non-Medical): No  Physical Activity: Sufficiently Active (09/09/2022)   Exercise Vital Sign    Days of Exercise per Week: 7 days    Minutes of Exercise per Session: 30 min  Stress: No Stress Concern Present (09/09/2022)   [place]    Feeling of Stress : Not at all  Social Connections: Moderately Integrated (09/09/2022)   Social Connection and Isolation Panel [NHANES]    Frequency of Communication with Friends and Family: More than three times a week    Frequency of Social Gatherings with Friends and Family: Three times a week    Attends Religious Services: More than 4 times per year    Active Member of Clubs or Organizations: Yes    Attends Archivist Meetings: More than 4 times per year    Marital  Status: Never married  Intimate Partner Violence: Not At Risk (09/09/2022)   Humiliation, Afraid, Rape, and Kick questionnaire    Fear of Current or Ex-Partner: No    Emotionally Abused: No    Physically Abused: No    Sexually Abused: No    Family History  Problem Relation Age of Onset   Hypertension Mother    Stroke Mother    Diabetes Mother    Alcohol abuse Father    Hypertension Father    Kidney disease Sister    Heart disease Sister    Hypertension Sister    Diabetes Sister    Lung cancer Sister    Heart disease Brother    Heart disease Brother    Breast cancer Neg Hx    Colon cancer Neg Hx    Esophageal cancer Neg Hx    Pancreatic cancer Neg Hx    Stomach cancer Neg Hx     Review of Systems  All other systems reviewed and are negative.      Objective:   Physical Exam Vitals reviewed.  Constitutional:      General: She is not in acute distress.    Appearance: She is well-developed. She is not diaphoretic.  HENT:     Head: Normocephalic and atraumatic.  Neck:     Thyroid: No thyromegaly.     Vascular:  No JVD.     Trachea: No tracheal deviation.   Cardiovascular:     Rate and Rhythm: Normal rate and regular rhythm.     Heart sounds: Normal heart sounds. No murmur heard.    No friction rub. No gallop.  Pulmonary:     Effort: Pulmonary effort is normal. No respiratory distress.     Breath sounds: Normal breath sounds. No stridor. No wheezing or rales.  Chest:     Chest wall: No tenderness.  Abdominal:     General: Bowel sounds are normal. There is no distension.     Palpations: Abdomen is soft. There is no mass.     Tenderness: There is no abdominal tenderness. There is no guarding or rebound.  Neurological:     Mental Status: She is alert.     Motor: No abnormal muscle tone.     Deep Tendon Reflexes: Reflexes are normal and symmetric.  Psychiatric:        Judgment: Judgment normal.        Assessment & Plan:   Hyperkalemia Patient is going to  change her diet and reduce her consumption of bananas, grapes, potatoes.  She will come back in 1 to 2 weeks and recheck potassium.  If persistently elevated, we may want consider hydrochlorothiazide versus Lokelma

## 2022-11-14 DIAGNOSIS — C182 Malignant neoplasm of ascending colon: Secondary | ICD-10-CM | POA: Diagnosis not present

## 2022-11-17 ENCOUNTER — Other Ambulatory Visit: Payer: Self-pay | Admitting: Surgery

## 2022-11-17 DIAGNOSIS — C182 Malignant neoplasm of ascending colon: Secondary | ICD-10-CM

## 2022-11-18 ENCOUNTER — Ambulatory Visit: Payer: Medicare HMO | Attending: Surgery

## 2022-11-18 ENCOUNTER — Other Ambulatory Visit: Payer: Self-pay

## 2022-11-18 ENCOUNTER — Encounter: Payer: Self-pay | Admitting: Gastroenterology

## 2022-11-18 DIAGNOSIS — M62838 Other muscle spasm: Secondary | ICD-10-CM | POA: Insufficient documentation

## 2022-11-18 DIAGNOSIS — R293 Abnormal posture: Secondary | ICD-10-CM | POA: Insufficient documentation

## 2022-11-18 DIAGNOSIS — R279 Unspecified lack of coordination: Secondary | ICD-10-CM | POA: Diagnosis present

## 2022-11-18 NOTE — Patient Instructions (Addendum)
Try to drink 3 of your bottles of water a day - slowly throughout the day.  -one bottle by lunch, one by 3:00, one by 6:00  Go to the bathroom every 4 hours!!!!!!  Balloon breathing: when trying to have a bowel movement, act like you're blowing up a balloon.   Talk to doctor doctor about starting psyllium husk (fiber supplement)     Squatty potty: When your knees are level or below the level of your hips, pelvic floor muscles are pressed against rectum, preventing ease of bowel movement. By getting knees above the level of the hips, these pelvic floor muscles relax, allowing easier passage of bowel movement. Ways to get knees above hips: o Squatty Potty (7inch and 9inch versions) o Small stool o Roll of toilet paper under each foot o Hardback book or stack of magazines under each foot  Relaxed Toileting mechanics: Once in this position, make sure to lean forward with forearms on thighs, wide knees, relaxed stomach, and breathe.     Bowel massage: To assist with more regular and more comfortable bowel movements, try performing bowel massage nightly for 5-10 minutes. Place hands in the lower right side of your abdomen to start; in small circles, massage up, across, and down the left side of your abdomen. Pressure does not need to be hard, but just comfortable. You can use lotion or oil to make more comfortable.    [facility] [address] [place], [place] [address] Phone # [phone] Fax [phone]

## 2022-11-18 NOTE — Therapy (Signed)
OUTPATIENT PHYSICAL THERAPY FEMALE PELVIC EVALUATION   Patient Name: Amy Powell MRN: [id number] DOB:10/30/1945, 77 y.o., female Today's Date: 11/18/2022  END OF SESSION:  PT End of Session - 11/18/22 1101     Visit Number 1    Date for PT Re-Evaluation 01/27/23    Authorization Type [employer]    PT Start Time 1100    PT Stop Time 1140    PT Time Calculation (min) 40 min    Activity Tolerance Patient tolerated treatment well    Behavior During Therapy WFL for tasks assessed/performed             Past Medical History:  Diagnosis Date   CKD (chronic kidney disease) stage 3, GFR 30-59 ml/min (HCC)    HLD (hyperlipidemia)    Osteoporosis    Past Surgical History:  Procedure Laterality Date   ABDOMINAL HYSTERECTOMY     for fibroids, TAHBSO   LAPAROSCOPIC RIGHT HEMI COLECTOMY Right 11/21/2021   Procedure: LAPAROSCOPIC RIGHT HEMI COLECTOMY, SMALL BOWEL RESECTION;  Surgeon: Ileana Roup, MD;  Location: [facility];  Service: General;  Laterality: Right;   TONSILLECTOMY     Patient Active Problem List   Diagnosis Date Noted   S/P right hemicolectomy 11/21/2021   Thyroglossal duct cyst 08/08/2021   Osteoporosis    CKD (chronic kidney disease) stage 3, GFR 30-59 ml/min (HCC)    HLD (hyperlipidemia)    Closed fracture of lower end of left ulna with routine healing 05/03/2018   BPPV (benign paroxysmal positional vertigo) 01/13/2013    PCP: Susy Frizzle, MD  REFERRING PROVIDER: Ileana Roup, MD  REFERRING DIAG: C18.2 (ICD-10-CM) - Malignant neoplasm of ascending colon  THERAPY DIAG:  Abnormal posture  Unspecified lack of coordination  Other muscle spasm  Rationale for Evaluation and Treatment: Rehabilitation  ONSET DATE: march 2023  SUBJECTIVE:                                                                                                                                                                                            SUBJECTIVE STATEMENT: Pt states that she has been struggling with constipation since surgery in March 2023. She is limited with what she can eat due to elevated Potassium for the last month for 2.  Fluid intake: Yes: a lot of water    PAIN:  Are you having pain? No   PRECAUTIONS: None  WEIGHT BEARING RESTRICTIONS: No  FALLS:  Has patient fallen in last 6 months? No  LIVING ENVIRONMENT: Lives with: lives with their family Lives in: House/apartment  OCCUPATION: retired  PLOF: Independent  PATIENT GOALS: to  have more regular bowel movements without straining  PERTINENT HISTORY:  Hysterectomy; Rt hemi colectomy; history of cancer Sexual abuse: No  BOWEL MOVEMENT: Pain with bowel movement: No Type of bowel movement:Frequency 1x/day and Strain Yes Fully empty rectum: No Leakage: No Pads: No Fiber supplement: Yes: she takes senokot  URINATION: Pain with urination: No Fully empty bladder: Yes: - Stream: Strong Urgency: No Frequency: every 4-5 hours Leakage:  none Pads: No   PREGNANCY: Vaginal deliveries 2 Tearing No C-section deliveries 0 Currently pregnant No   OBJECTIVE:  11/18/22:  COGNITION: Overall cognitive status: Within functional limits for tasks assessed     SENSATION: Light touch: Appears intact Proprioception: Appears intact  MUSCLE LENGTH: Hip flexors tested prone with 90 degree flexion   FUNCTIONAL TESTS:   Squat: WNL Single leg stance: >10 sec bil   GAIT: Comments: decreased bil hip extension   POSTURE: rounded shoulders, forward head, decreased lumbar lordosis, increased thoracic kyphosis, and posterior pelvic tilt  PELVIC ALIGNMENT:  LUMBARAROM/PROM:  A/PROM A/PROM  Eval (% available)  Flexion 100  Extension 25  Right lateral flexion 100  Left lateral flexion 100  Right rotation 50  Left rotation 50   (Blank rows = not tested)  LOWER EXTREMITY ROM:   LOWER EXTREMITY MMT: Bil hip strength grossly  5/5   PALPATION:   General  significant restriction in abdomen, most notable in Rt upper/lower quadrant                External Perineal Exam pt deferred                             Internal Pelvic Floor pt deferred   Patient confirms identification and approves PT to assess internal pelvic floor and treatment No  PELVIC MMT: pt deferred         TONE: Pt deferred  PROLAPSE: Pt deferred   TODAY'S TREATMENT:                                                                                                                              DATE:  11/18/22  EVAL  Therapeutic activities: Squatty potty Relaxed toilet mechanics Bowel massage Fiber - discuss with doctor since there are dietary restrictions Increase water intake and slow throughout the day Balloon breathing    PATIENT EDUCATION:  Education details: see above Person educated: Patient Education method: Explanation, Demonstration, Tactile cues, Verbal cues, and Handouts Education comprehension: verbalized understanding  HOME EXERCISE PROGRAM: Written handout   ASSESSMENT:  CLINICAL IMPRESSION: Patient is a 77 y.o. female who was seen today for physical therapy evaluation and treatment for constipation. Exam findings focused externally due to pt deferring internal pelvic floor exam; she demonstrates overall good findings of hip/core strength and lumbar A/ROM. She does have considerable restriction in abdomen, greater in Rt lower/upper quadrants, and restricted anterior hip flexibility. Signs and symptoms are most consistent with abdominal  scar tissue and anterior core restriction that is impacting bowel movements; poor toilet mechanics, ability to relax, and lack of fiber are also likely contributing to condition. Initial treatment consisted of lifestyle changes to include squatty potty, relaxed toilet mechanics, balloon breathing, bowel massage, and fiber/water intake. She will continue to benefit from skilled PT  intervention in order to decrease constipation and address impairments.   OBJECTIVE IMPAIRMENTS: decreased coordination, decreased mobility, decreased strength, increased fascial restrictions, increased muscle spasms, improper body mechanics, postural dysfunction, and pain.   ACTIVITY LIMITATIONS:  bowel movements  PARTICIPATION LIMITATIONS:  none  PERSONAL FACTORS: 1 comorbidity: hx of colon cancer with Rt hemi colectomy  are also affecting patient's functional outcome.   REHAB POTENTIAL: Good  CLINICAL DECISION MAKING: Stable/uncomplicated  EVALUATION COMPLEXITY: Low   GOALS: Goals reviewed with patient? Yes  SHORT TERM GOALS: Target date: 12/23/22  Pt will be independent with HEP.   Baseline: Goal status: INITIAL  2.  Pt will be independent with diaphragmatic breathing and down training activities in order to improve pelvic floor relaxation.  Baseline:  Goal status: INITIAL  3.  Pt will be independent with use of squatty potty, relaxed toileting mechanics, and improved bowel movement techniques in order to increase ease of bowel movements and complete evacuation.   Baseline:  Goal status: INITIAL    LONG TERM GOALS: Target date: 01/27/2023  Pt will be independent with advanced HEP.   Baseline:  Goal status: INITIAL  2.  Pt will have complete daily bowel movement without straining.  Baseline:  Goal status: INITIAL  3.  Pt will demonstrate improved anterior hip/abdominal mobility.  Baseline:  Goal status: INITIAL   PLAN:  PT FREQUENCY: 1-2x/week  PT DURATION: 6 months  PLANNED INTERVENTIONS: Therapeutic exercises, Therapeutic activity, Neuromuscular re-education, Balance training, Gait training, Patient/Family education, Self Care, Joint mobilization, Dry Needling, Biofeedback, and Manual therapy  PLAN FOR NEXT SESSION: anterior hip/abdomen mobility exercises; bowel massage; abdominal myofascial release  Heather Roberts, PT, DPT02/20/241:24 PM

## 2022-11-24 ENCOUNTER — Ambulatory Visit (AMBULATORY_SURGERY_CENTER): Payer: Medicare HMO

## 2022-11-24 ENCOUNTER — Encounter: Payer: Self-pay | Admitting: Gastroenterology

## 2022-11-24 VITALS — Ht 66.0 in | Wt 138.0 lb

## 2022-11-24 DIAGNOSIS — C182 Malignant neoplasm of ascending colon: Secondary | ICD-10-CM

## 2022-11-24 DIAGNOSIS — Z86010 Personal history of colonic polyps: Secondary | ICD-10-CM

## 2022-11-24 MED ORDER — NA SULFATE-K SULFATE-MG SULF 17.5-3.13-1.6 GM/177ML PO SOLN: 1.0000 | Freq: Once | ORAL | 0 refills | Status: AC

## 2022-11-24 NOTE — Progress Notes (Signed)
No egg or soy allergy known to patient  No issues known to pt with past sedation with any surgeries or procedures Patient denies ever being told they had issues or difficulty with intubation  No FH of Malignant Hyperthermia Pt is not on diet pills Pt is not on  home 02  Pt is not on blood thinners  Pt reports constipation issues that causes straining   No A fib or A flutter Have any cardiac testing pending-- no  Pt instructed to use [email or web address] or GoodRx for a price reduction on prep

## 2022-12-08 ENCOUNTER — Telehealth: Payer: Self-pay | Admitting: Gastroenterology

## 2022-12-08 ENCOUNTER — Encounter: Payer: Self-pay | Admitting: Gastroenterology

## 2022-12-08 ENCOUNTER — Ambulatory Visit (AMBULATORY_SURGERY_CENTER): Payer: Medicare HMO | Admitting: Gastroenterology

## 2022-12-08 VITALS — BP 113/64 | HR 75 | Temp 97.5°F | Resp 12 | Ht 66.0 in | Wt 125.0 lb

## 2022-12-08 DIAGNOSIS — Z85038 Personal history of other malignant neoplasm of large intestine: Secondary | ICD-10-CM

## 2022-12-08 DIAGNOSIS — Z1211 Encounter for screening for malignant neoplasm of colon: Secondary | ICD-10-CM

## 2022-12-08 DIAGNOSIS — Z86010 Personal history of colonic polyps: Secondary | ICD-10-CM | POA: Diagnosis not present

## 2022-12-08 DIAGNOSIS — N183 Chronic kidney disease, stage 3 unspecified: Secondary | ICD-10-CM | POA: Diagnosis not present

## 2022-12-08 DIAGNOSIS — Z08 Encounter for follow-up examination after completed treatment for malignant neoplasm: Secondary | ICD-10-CM

## 2022-12-08 MED ORDER — SODIUM CHLORIDE 0.9 % IV SOLN: 500.0000 mL | Freq: Once | INTRAVENOUS | Status: DC

## 2022-12-08 NOTE — Op Note (Signed)
[facility] Patient Name: Amy Powell Procedure Date: 12/08/2022 10:14 AM MRN: [id number] Endoscopist: Nicki Reaper E. Candis Schatz , MD, [id number] Age: 77 Referring MD:  Date of Birth: 10-31-1945 Gender: Female Account #: [id number] Procedure:                Colonoscopy Indications:              High risk colon cancer surveillance: Personal                            history of colon cancer Medicines:                Monitored Anesthesia Care Procedure:                Pre-Anesthesia Assessment:                           - Prior to the procedure, a History and Physical                            was performed, and patient medications and                            allergies were reviewed. The patient's tolerance of                            previous anesthesia was also reviewed. The risks                            and benefits of the procedure and the sedation                            options and risks were discussed with the patient.                            All questions were answered, and informed consent                            was obtained. Prior Anticoagulants: The patient has                            taken no anticoagulant or antiplatelet agents. ASA                            Grade Assessment: II - A patient with mild systemic                            disease. After reviewing the risks and benefits,                            the patient was deemed in satisfactory condition to                            undergo the procedure.  After obtaining informed consent, the colonoscope                            was passed under direct vision. Throughout the                            procedure, the patient's blood pressure, pulse, and                            oxygen saturations were monitored continuously. The                            Olympus [id number]) Colonoscope was                            introduced through the anus and  advanced to the the                            ileocolonic anastomosis. The colonoscopy was                            performed without difficulty. The patient tolerated                            the procedure well. The quality of the bowel                            preparation was good. The bowel preparation used                            was SUPREP via split dose instruction. Scope In: 10:27:48 AM Scope Out: 10:42:47 AM Scope Withdrawal Time: 0 hours 11 minutes 15 seconds  Total Procedure Duration: 0 hours 14 minutes 59 seconds  Findings:                 The perianal and digital rectal examinations were                            normal. Pertinent negatives include normal                            sphincter tone and no palpable rectal lesions.                           There was evidence of a prior side-to-side                            ileo-colonic anastomosis in the proximal transverse                            colon. This was patent and was characterized by                            healthy appearing mucosa and visible sutures. The  anastomosis was not traversed.                           Multiple small-mouthed diverticula were found in                            the sigmoid colon, descending colon and transverse                            colon.                           The exam was otherwise normal throughout the                            examined colon.                           The retroflexed view of the distal rectum and anal                            verge was normal and showed no anal or rectal                            abnormalities. Complications:            No immediate complications. Estimated Blood Loss:     Estimated blood loss: none. Impression:               - Patent side-to-side ileo-colonic anastomosis,                            characterized by healthy appearing mucosa and                            visible sutures.                            - Diverticulosis in the sigmoid colon, in the                            descending colon and in the transverse colon.                           - The distal rectum and anal verge are normal on                            retroflexion view.                           - No specimens collected.                           - The GI Genius (intelligent endoscopy module),                            computer-aided polyp detection system powered by AI  was utilized to detect colorectal polyps through                            enhanced visualization during colonoscopy. Recommendation:           - Patient has a contact number available for                            emergencies. The signs and symptoms of potential                            delayed complications were discussed with the                            patient. Return to normal activities tomorrow.                            Written discharge instructions were provided to the                            patient.                           - Resume previous diet.                           - Continue present medications.                           - Repeat colonoscopy in 3 years for surveillance. Kel Senn E. Candis Schatz, MD 12/08/2022 10:50:18 AM This report has been signed electronically.

## 2022-12-08 NOTE — Telephone Encounter (Signed)
Attempted to return the patient's phone call. Left VM.

## 2022-12-08 NOTE — Patient Instructions (Signed)
REPEAT Colonoscopy in 3 years for surveillance.  Handouts provided:  Diverticulosis  YOU HAD AN ENDOSCOPIC PROCEDURE TODAY AT [facility]:   Refer to the procedure report that was given to you for any specific questions about what was found during the examination.  If the procedure report does not answer your questions, please call your gastroenterologist to clarify.  If you requested that your care partner not be given the details of your procedure findings, then the procedure report has been included in a sealed envelope for you to review at your convenience later.  YOU SHOULD EXPECT: Some feelings of bloating in the abdomen. Passage of more gas than usual.  Walking can help get rid of the air that was put into your GI tract during the procedure and reduce the bloating. If you had a lower endoscopy (such as a colonoscopy or flexible sigmoidoscopy) you may notice spotting of blood in your stool or on the toilet paper. If you underwent a bowel prep for your procedure, you may not have a normal bowel movement for a few days.  Please Note:  You might notice some irritation and congestion in your nose or some drainage.  This is from the oxygen used during your procedure.  There is no need for concern and it should clear up in a day or so.  SYMPTOMS TO REPORT IMMEDIATELY:  Following lower endoscopy (colonoscopy or flexible sigmoidoscopy):  Excessive amounts of blood in the stool  Significant tenderness or worsening of abdominal pains  Swelling of the abdomen that is new, acute  Fever of 100F or higher  For urgent or emergent issues, a gastroenterologist can be reached at any hour by calling [phone]. Do not use MyChart messaging for urgent concerns.    DIET:  We do recommend a small meal at first, but then you may proceed to your regular diet.  Drink plenty of fluids but you should avoid alcoholic beverages for 24 hours.  ACTIVITY:  You should plan to take it easy for the  rest of today and you should NOT DRIVE or use heavy machinery until tomorrow (because of the sedation medicines used during the test).    FOLLOW UP: Our staff will call the number listed on your records the next business day following your procedure.  We will call around 7:15- 8:00 am to check on you and address any questions or concerns that you may have regarding the information given to you following your procedure. If we do not reach you, we will leave a message.     If any biopsies were taken you will be contacted by phone or by letter within the next 1-3 weeks.  Please call us at [phone] if you have not heard about the biopsies in 3 weeks.    SIGNATURES/CONFIDENTIALITY: You and/or your care partner have signed paperwork which will be entered into your electronic medical record.  These signatures attest to the fact that that the information above on your After Visit Summary has been reviewed and is understood.  Full responsibility of the confidentiality of this discharge information lies with you and/or your care-partner.

## 2022-12-08 NOTE — Progress Notes (Signed)
Pt's states no medical or surgical changes since previsit or office visit. 

## 2022-12-08 NOTE — Progress Notes (Signed)
[facility] History and Physical   Primary Care Physician:  Susy Frizzle, MD   Reason for Procedure:   Colon cancer surveillance  Plan:    Surveillance colonoscopy     HPI: Amy Powell is a 77 y.o. female undergoing surveillance colonoscopy.  She was found to have a cecal mass on a colonoscopy in Nov 2022 following a positive FIT test.  She also had a large (72m) polyp in the proximal ascending colon that was not removed as well as 15 other smaller tubular adenomas. She is s/p R hemicolectomy in Feb 2023 by Dr. WDema Severin  She has no chronic GI symptoms and recovered well from her surgery.   Past Medical History:  Diagnosis Date   Cancer ([facility]    colon   CKD (chronic kidney disease) stage 3, GFR 30-59 ml/min (HCC)    HLD (hyperlipidemia)    Osteoporosis     Past Surgical History:  Procedure Laterality Date   ABDOMINAL HYSTERECTOMY     for fibroids, [facility]BSO   LAPAROSCOPIC RIGHT HEMI COLECTOMY Right 11/21/2021   Procedure: LAPAROSCOPIC RIGHT HEMI COLECTOMY, SMALL BOWEL RESECTION;  Surgeon: WIleana Roup MD;  Location: [facility];  Service: General;  Laterality: Right;   TONSILLECTOMY      Prior to Admission medications   Medication Sig Start Date End Date Taking? Authorizing Provider  Cholecalciferol (VITAMIN D3) 125 MCG (5000 UT) TBDP Take 5,000 Units by mouth daily at 2 PM.   Yes [provider]  linaclotide (LINZESS) 145 MCG CAPS capsule Take 1 capsule (145 mcg total) by mouth daily before breakfast. Patient not taking: Reported on 11/18/2022 10/21/22   PSusy Frizzle MD  senna (SENOKOT) 8.6 MG tablet Take 1 tablet by mouth as needed for constipation.    [provider]    Current Outpatient Medications  Medication Sig Dispense Refill   Cholecalciferol (VITAMIN D3) 125 MCG (5000 UT) TBDP Take 5,000 Units by mouth daily at 2 PM.     linaclotide (LINZESS) 145 MCG CAPS capsule Take 1 capsule (145 mcg total) by mouth daily before  breakfast. (Patient not taking: Reported on 11/18/2022) 30 capsule 11   senna (SENOKOT) 8.6 MG tablet Take 1 tablet by mouth as needed for constipation.     Current Facility-Administered Medications  Medication Dose Route Frequency Provider Last Rate Last Admin   0.9 %  sodium chloride infusion  500 mL Intravenous Once CDaryel November MD        Allergies as of 12/08/2022 - Review Complete 12/08/2022  Allergen Reaction Noted   Penicillins Itching 01/13/2013    Family History  Problem Relation Age of Onset   Hypertension Mother    Stroke Mother    Diabetes Mother    Alcohol abuse Father    Hypertension Father    Kidney disease Sister    Heart disease Sister    Hypertension Sister    Diabetes Sister    Lung cancer Sister    Heart disease Brother    Heart disease Brother    Breast cancer Neg Hx    Colon cancer Neg Hx    Esophageal cancer Neg Hx    Pancreatic cancer Neg Hx    Stomach cancer Neg Hx    Colon polyps Neg Hx    Rectal cancer Neg Hx     Social History   Socioeconomic History   Marital status: Single    Spouse name: Not on file   Number of children: Not on file  Years of education: Not on file   Highest education level: Not on file  Occupational History   Not on file  Tobacco Use   Smoking status: Never   Smokeless tobacco: Never  Vaping Use   Vaping Use: Never used  Substance and Sexual Activity   Alcohol use: Yes    Comment: rarely   Drug use: No   Sexual activity: Not on file    Comment: Works at [employer].  Other Topics Concern   Not on file  Social History Narrative   Not on file   Social Determinants of Health   Financial Resource Strain: Low Risk  (09/09/2022)   Overall Financial Resource Strain (CARDIA)    Difficulty of Paying Living Expenses: Not hard at all  Food Insecurity: No Food Insecurity (09/09/2022)   Hunger Vital Sign    Worried About Running Out of Food in the Last Year: Never true    Ran Out of Food in the Last Year:  Never true  Transportation Needs: No Transportation Needs (09/09/2022)   PRAPARE - Hydrologist (Medical): No    Lack of Transportation (Non-Medical): No  Physical Activity: Sufficiently Active (09/09/2022)   Exercise Vital Sign    Days of Exercise per Week: 7 days    Minutes of Exercise per Session: 30 min  Stress: No Stress Concern Present (09/09/2022)   [place]    Feeling of Stress : Not at all  Social Connections: Moderately Integrated (09/09/2022)   Social Connection and Isolation Panel [NHANES]    Frequency of Communication with Friends and Family: More than three times a week    Frequency of Social Gatherings with Friends and Family: Three times a week    Attends Religious Services: More than 4 times per year    Active Member of Clubs or Organizations: Yes    Attends Archivist Meetings: More than 4 times per year    Marital Status: Never married  Intimate Partner Violence: Not At Risk (09/09/2022)   Humiliation, Afraid, Rape, and Kick questionnaire    Fear of Current or Ex-Partner: No    Emotionally Abused: No    Physically Abused: No    Sexually Abused: No    Review of Systems:  All other review of systems negative except as mentioned in the HPI.  Physical Exam: Vital signs BP 131/73   Pulse 60   Temp (!) 97.5 F (36.4 C)   Ht '5\' 6"'$  (1.676 m)   Wt 125 lb (56.7 kg)   SpO2 98%   BMI 20.18 kg/m   General:   Alert,  Well-developed, well-nourished, pleasant and cooperative in NAD Airway:  Mallampati 1 Lungs:  Clear throughout to auscultation.   Heart:  Regular rate and rhythm; no murmurs, clicks, rubs,  or gallops. Abdomen:  Soft, nontender and nondistended. Normal bowel sounds.   Neuro/Psych:  Normal mood and affect. A and O x 3   Amy Phillis E. Candis Schatz, MD [facility]

## 2022-12-08 NOTE — Progress Notes (Signed)
Pt resting comfortably. VSS. Airway intact. SBAR complete to RN. All questions answered.   

## 2022-12-08 NOTE — Telephone Encounter (Signed)
Inbound call from patient, states she was told her Potassium was high and would like to know the levels.

## 2022-12-09 ENCOUNTER — Telehealth: Payer: Self-pay | Admitting: *Deleted

## 2022-12-09 NOTE — Telephone Encounter (Signed)
  Follow up Call-     12/08/2022    9:56 AM 08/27/2021    7:15 AM  Call back number  Post procedure Call Back phone  # [phone]  Permission to leave phone message Yes Yes     Patient questions:   Message left to call us if necessary.

## 2022-12-10 ENCOUNTER — Telehealth: Payer: Self-pay | Admitting: Gastroenterology

## 2022-12-10 NOTE — Telephone Encounter (Signed)
Inbound call from patient states she was speaking with someone about taking potassium?   Please advise.

## 2022-12-11 NOTE — Telephone Encounter (Signed)
Left message on machine to call back  

## 2022-12-16 NOTE — Telephone Encounter (Signed)
Pt has not returned call. Will await further communication from pt.

## 2022-12-23 ENCOUNTER — Ambulatory Visit
Admission: RE | Admit: 2022-12-23 | Discharge: 2022-12-23 | Disposition: A | Discharge status: Home / Self Care | Payer: Medicare HMO | Source: Ambulatory Visit | Attending: Family Medicine | Admitting: Family Medicine

## 2022-12-23 DIAGNOSIS — Z1231 Encounter for screening mammogram for malignant neoplasm of breast: Secondary | ICD-10-CM

## 2022-12-25 ENCOUNTER — Ambulatory Visit: Payer: PRIVATE HEALTH INSURANCE

## 2023-01-27 DIAGNOSIS — Z833 Family history of diabetes mellitus: Secondary | ICD-10-CM | POA: Diagnosis not present

## 2023-01-27 DIAGNOSIS — E785 Hyperlipidemia, unspecified: Secondary | ICD-10-CM | POA: Diagnosis not present

## 2023-01-27 DIAGNOSIS — Z85038 Personal history of other malignant neoplasm of large intestine: Secondary | ICD-10-CM | POA: Diagnosis not present

## 2023-01-27 DIAGNOSIS — K5900 Constipation, unspecified: Secondary | ICD-10-CM | POA: Diagnosis not present

## 2023-01-27 DIAGNOSIS — I700 Atherosclerosis of aorta: Secondary | ICD-10-CM | POA: Diagnosis not present

## 2023-01-27 DIAGNOSIS — J45909 Unspecified asthma, uncomplicated: Secondary | ICD-10-CM | POA: Diagnosis not present

## 2023-01-27 DIAGNOSIS — N1831 Chronic kidney disease, stage 3a: Secondary | ICD-10-CM | POA: Diagnosis not present

## 2023-01-27 DIAGNOSIS — Z008 Encounter for other general examination: Secondary | ICD-10-CM | POA: Diagnosis not present

## 2023-01-27 DIAGNOSIS — Z811 Family history of alcohol abuse and dependence: Secondary | ICD-10-CM | POA: Diagnosis not present

## 2023-01-27 DIAGNOSIS — Z8249 Family history of ischemic heart disease and other diseases of the circulatory system: Secondary | ICD-10-CM | POA: Diagnosis not present

## 2023-01-27 DIAGNOSIS — Z880 Allergy status to penicillin: Secondary | ICD-10-CM | POA: Diagnosis not present

## 2023-01-27 DIAGNOSIS — I739 Peripheral vascular disease, unspecified: Secondary | ICD-10-CM | POA: Diagnosis not present

## 2023-02-05 ENCOUNTER — Ambulatory Visit (INDEPENDENT_AMBULATORY_CARE_PROVIDER_SITE_OTHER): Payer: Medicare HMO | Admitting: Orthopedic Surgery

## 2023-02-05 DIAGNOSIS — L97511 Non-pressure chronic ulcer of other part of right foot limited to breakdown of skin: Secondary | ICD-10-CM

## 2023-02-10 ENCOUNTER — Encounter: Payer: Self-pay | Admitting: Orthopedic Surgery

## 2023-02-10 IMAGING — CT CT NECK W/ CM
4 of 6 series · 13 of 33 positions shown, 15 images · IV contrast (APPLIED)
Comparison: Brain MRI 07/02/2011.

CLINICAL DATA: Neck mass, initial workup; midline neck mass.
Question goiter. Additional history obtained from electronic medical
record: Patient reports swollen/sore throat since yesterday,
difficulty swallowing.

EXAM:
CT NECK WITH CONTRAST
TECHNIQUE: Multidetector CT imaging of the neck was performed using the
standard protocol following the bolus administration of intravenous
contrast.
CONTRAST:  75mL OMNIPAQUE IOHEXOL 300 MG/ML  SOLN

[Series 3: axial neck · axial · 0.68mm/px · z∈[-188,-126]mm · 2 of 95 slices shown]
[im 32/95  bone]
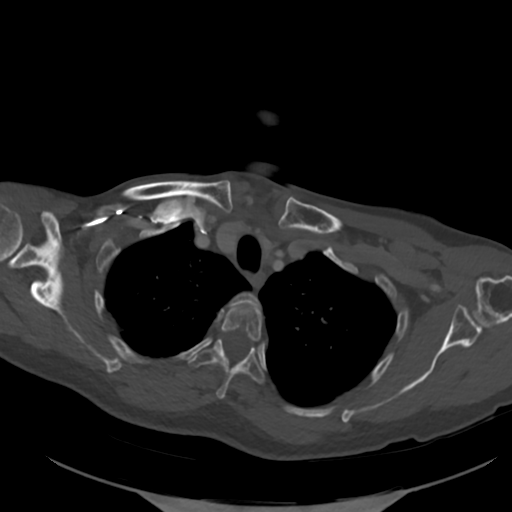
[im 63/95  bone]
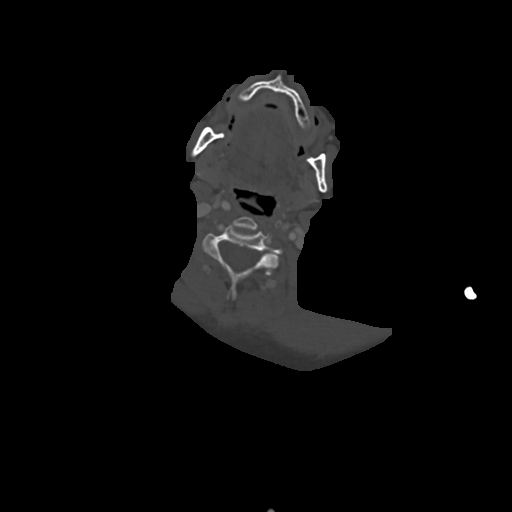

[Series 6: sag neck · sagittal · 0.53mm/px · 5 of 145 slices shown, 6 images]
[im 49/145  bone]
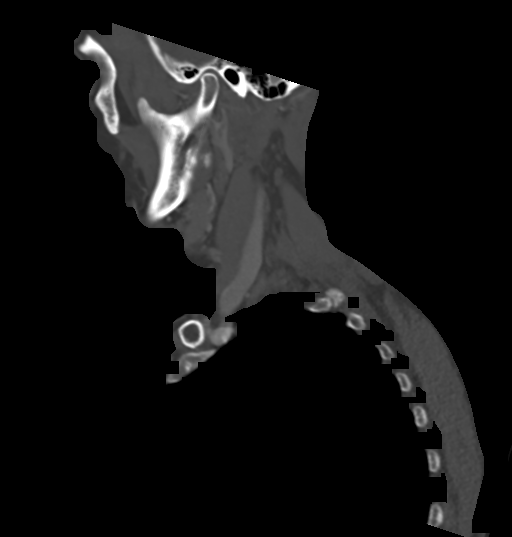
[im 61/145  bone]
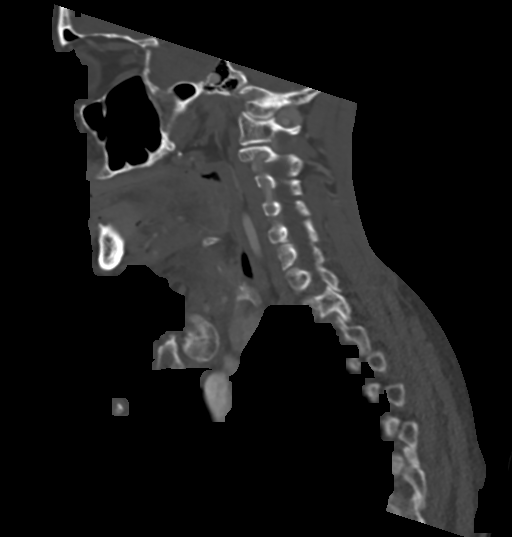
[im 73/145  soft-tissue]
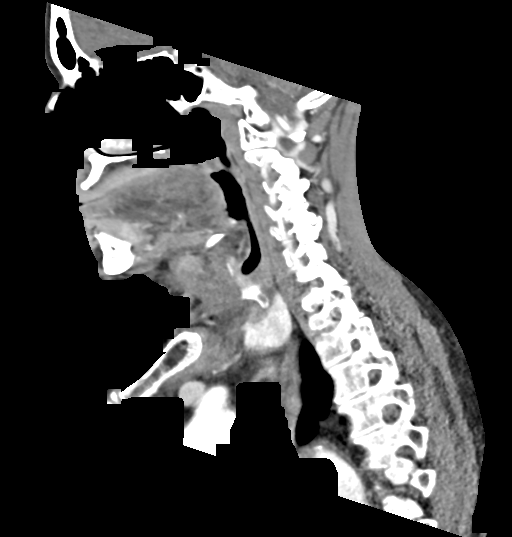
[im 73/145  bone]
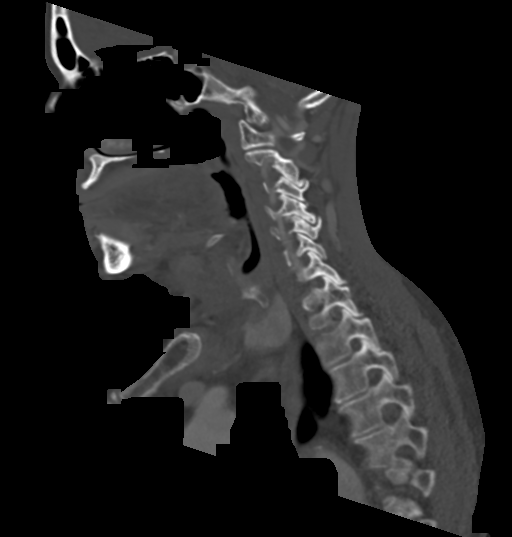
[im 85/145  bone]
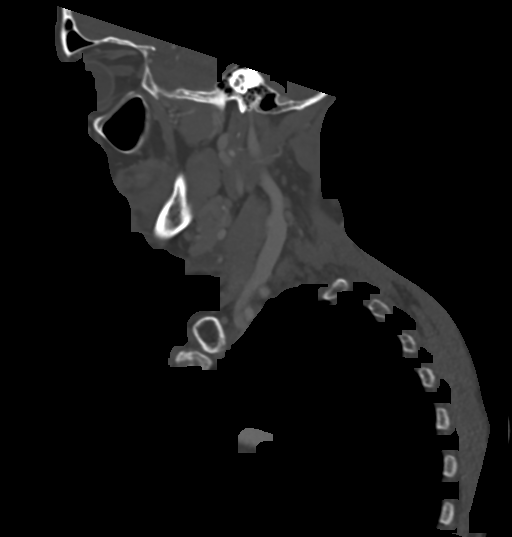
[im 97/145  bone]
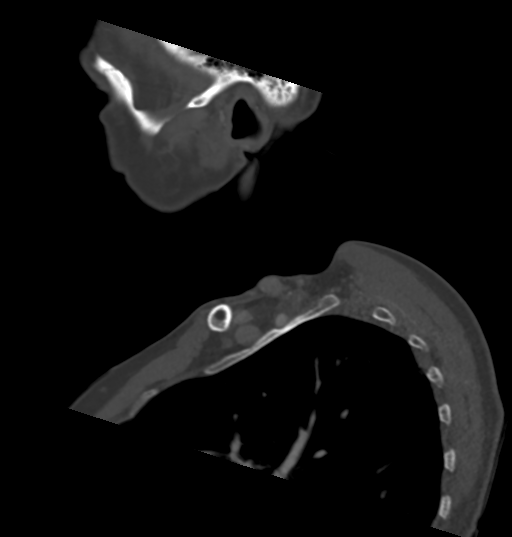

[Series 7: cor neck · coronal · 0.55mm/px · 3 of 122 slices shown]
[im 36/122  bone]
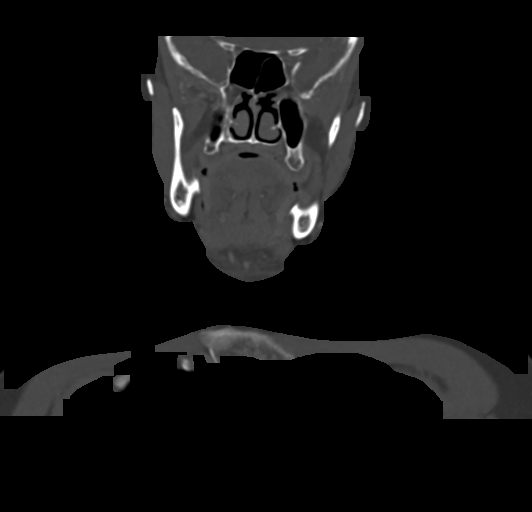
[im 50/122  bone]
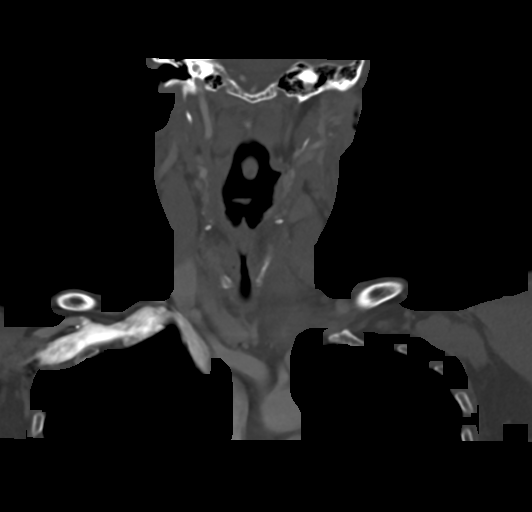
[im 65/122  bone]
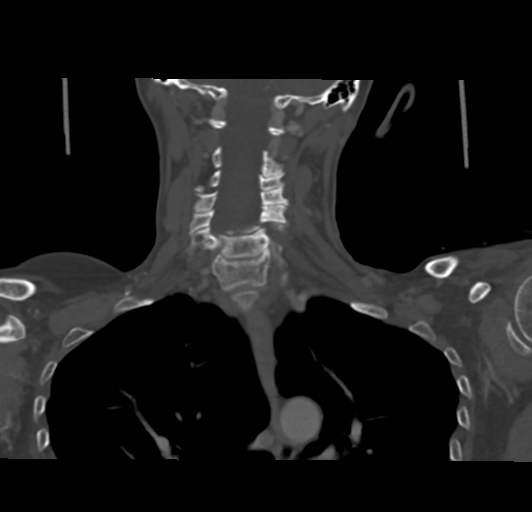

[Series 8: ax oropharynx · axial · 0.53mm/px · z∈[-261,-124]mm · 3 of 140 slices shown, 4 images]
[im 35/140  soft-tissue]
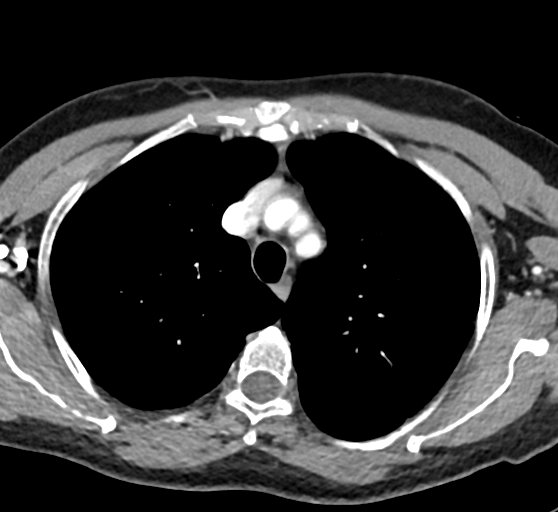
[im 35/140  bone]
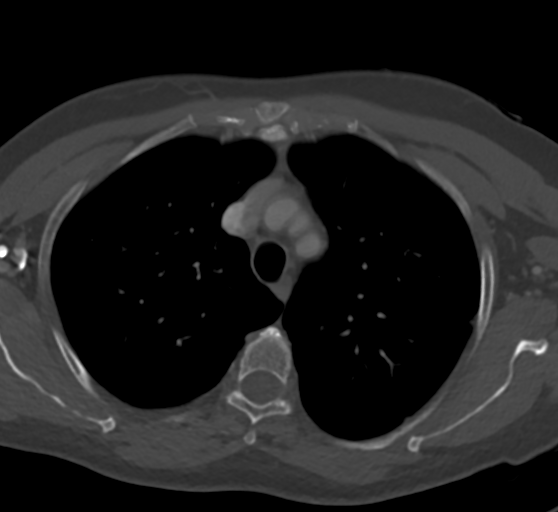
[im 70/140  bone]
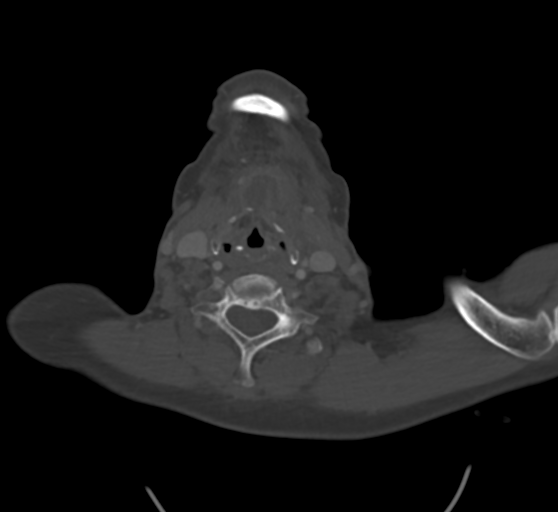
[im 105/140  bone]
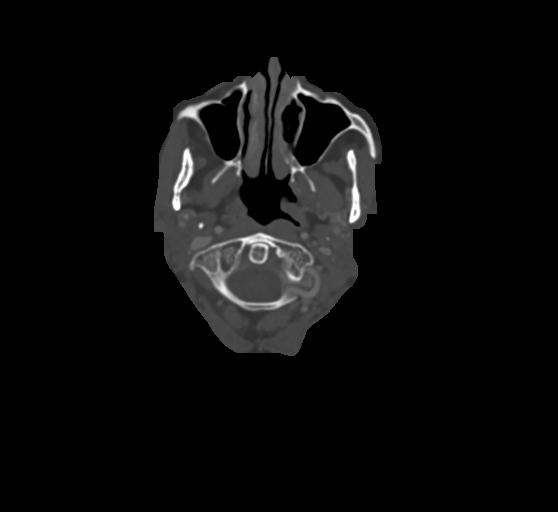

[13 of 33 positions shown; findings below may reference images not displayed]

FINDINGS: Pharynx and larynx: The patient is edentulous. No appreciable
swelling or discrete mass within the oral cavity, pharynx or larynx.

Salivary glands: Two punctate calcifications versus stones within
the right parotid tail. The bilateral parotid and submandibular
glands are otherwise unremarkable.

Thyroid: Subcentimeter thyroid nodules, not meeting consensus
criteria for ultrasound follow-up based on size.

Lymph nodes: No pathologically enlarged cervical chain lymph nodes.

Vascular: The major vascular structures of the neck are patent.

Limited intracranial: No evidence of acute intracranial abnormality
within the field of view.

Visualized orbits: No acute or significant orbital finding.

Mastoids and visualized paranasal sinuses: No significant paranasal
sinus disease or mastoid effusion at the imaged levels.

Skeleton: Cervical spondylosis. Cervicothoracic scoliosis. No acute
bony abnormality or aggressive osseous lesion.

Upper chest: No consolidation within the imaged lung apices.

Other: Within the midline anterior neck, interposed between the
hyoid bone and thyroid cartilage, there is a 2.8 x 3.0 x 2.2 cm
centrally low-attenuation lesion with a peripheral enhancing soft
tissue rim. Additionally, there is an internal focus of linear
hyperdensity/enhancement. Surrounding edema within the strap muscles
and ventral neck.
IMPRESSION: 2.8 x 3.0 x 2.2 cm centrally low-attenuation lesion with an
enhancing soft tissue rim in the midline anterior neck (interposed
between the hyoid bone and thyroid cartilage). There is an internal
linear focus of hyperdensity/enhancement. Surrounding edema within
the ventral strap muscles and anterior neck. Given the location,
somewhat complex appearance and acute presentation, this is favored
to reflect an actively infected thyroglossal duct cyst. ENT
consultation is recommended.

Two punctate calcifications versus stones within the right parotid
tail, incidentally noted.

## 2023-02-10 NOTE — Progress Notes (Signed)
Office Visit Note   Patient: Amy Powell           Date of Birth: 09-18-1946           MRN: [id number] Visit Date: 02/05/2023              Requested by: Donita Brooks, MD [address] [place],  [place] [address] PCP: Donita Brooks, MD  Chief Complaint  Patient presents with   Right Foot - Wound Check      HPI: Patient is a 77 year old woman who is seen for initial evaluation for ulceration right forefoot.  Assessment & Plan: Visit Diagnoses:  1. Non-pressure chronic ulcer of other part of right foot limited to breakdown of skin (HCC)     Plan: Ulcers were debrided there is healthy granulation tissue no signs of infection.  Patient pressure offloading and follow-up as needed  Follow-Up Instructions: No follow-ups on file.   Ortho Exam  Patient is alert, oriented, no adenopathy, well-dressed, normal affect, normal respiratory effort. Examination patient has a palpable pulse.  She has an ulcer over the great toe metatarsal head and base of the fifth metatarsal.  She is status post bunion and claw toe surgery on the right.  After informed consent a 10 blade knife was used debride the skin and soft tissue back to healthy viable tissue.  There is no abscess no exposed bone or tendon no signs of infection.  The ulcer of the great toe is 2 cm in diameter.  The ulcer of the third toe is 1 cm in diameter.  Imaging: No results found. No images are attached to the encounter.  Labs: Lab Results  Component Value Date   HGBA1C 6.1 (H) 02/16/2015   HGBA1C 6.1 (H) 07/02/2011     Lab Results  Component Value Date   ALBUMIN 3.8 11/04/2021   ALBUMIN 3.6 02/16/2015    No results found for: "MG" No results found for: "VD25OH"  No results found for: "PREALBUMIN"    Latest Ref Rng & Units 10/21/2022    8:17 AM 11/23/2021    4:11 AM 11/22/2021    4:34 AM  CBC EXTENDED  WBC 3.8 - 10.8 Thousand/uL 6.4  14.3  13.3   RBC 3.80 - 5.10 Million/uL 5.20  3.90  3.73    Hemoglobin 11.7 - 15.5 g/dL 19.1  8.9  8.4   HCT 47.8 - 45.0 % 42.2  29.3  27.5   Platelets 140 - 400 Thousand/uL 184  218  198   NEUT# 1,500 - 7,800 cells/uL 4,083     Lymph# 850 - 3,900 cells/uL 1,555        There is no height or weight on file to calculate BMI.  Orders:  No orders of the defined types were placed in this encounter.  No orders of the defined types were placed in this encounter.    Procedures: No procedures performed  Clinical Data: No additional findings.  ROS:  All other systems negative, except as noted in the HPI. Review of Systems  Objective: Vital Signs: There were no vitals taken for this visit.  Specialty Comments:  No specialty comments available.  PMFS History: Patient Active Problem List   Diagnosis Date Noted   S/P right hemicolectomy 11/21/2021   Thyroglossal duct cyst 08/08/2021   Bilateral sensorineural hearing loss 01/20/2020   Subjective tinnitus, bilateral 01/20/2020   Osteoporosis    CKD (chronic kidney disease) stage 3, GFR 30-59 ml/min (HCC)  HLD (hyperlipidemia)    Closed fracture of lower end of left ulna with routine healing 05/03/2018   BPPV (benign paroxysmal positional vertigo) 01/13/2013   Past Medical History:  Diagnosis Date   Cancer (HCC)    colon   CKD (chronic kidney disease) stage 3, GFR 30-59 ml/min (HCC)    HLD (hyperlipidemia)    Osteoporosis     Family History  Problem Relation Age of Onset   Hypertension Mother    Stroke Mother    Diabetes Mother    Alcohol abuse Father    Hypertension Father    Kidney disease Sister    Heart disease Sister    Hypertension Sister    Diabetes Sister    Lung cancer Sister    Heart disease Brother    Heart disease Brother    Breast cancer Neg Hx    Colon cancer Neg Hx    Esophageal cancer Neg Hx    Pancreatic cancer Neg Hx    Stomach cancer Neg Hx    Colon polyps Neg Hx    Rectal cancer Neg Hx     Past Surgical History:  Procedure Laterality Date    ABDOMINAL HYSTERECTOMY     for fibroids, TAHBSO   LAPAROSCOPIC RIGHT HEMI COLECTOMY Right 11/21/2021   Procedure: LAPAROSCOPIC RIGHT HEMI COLECTOMY, SMALL BOWEL RESECTION;  Surgeon: Andria Meuse, MD;  Location: [facility];  Service: General;  Laterality: Right;   TONSILLECTOMY     Social History   Occupational History   Not on file  Tobacco Use   Smoking status: Never   Smokeless tobacco: Never  Vaping Use   Vaping Use: Never used  Substance and Sexual Activity   Alcohol use: Yes    Comment: rarely   Drug use: No   Sexual activity: Not on file    Comment: Works at [employer].

## 2023-06-23 ENCOUNTER — Ambulatory Visit (INDEPENDENT_AMBULATORY_CARE_PROVIDER_SITE_OTHER): Payer: Medicare HMO | Admitting: Family Medicine

## 2023-06-23 ENCOUNTER — Encounter: Payer: Self-pay | Admitting: Family Medicine

## 2023-06-23 VITALS — BP 130/70 | HR 63 | Temp 98.5°F | Ht 66.0 in | Wt 133.6 lb

## 2023-06-23 DIAGNOSIS — Z23 Encounter for immunization: Secondary | ICD-10-CM | POA: Diagnosis not present

## 2023-06-23 DIAGNOSIS — Z13220 Encounter for screening for lipoid disorders: Secondary | ICD-10-CM | POA: Diagnosis not present

## 2023-06-23 DIAGNOSIS — Z0001 Encounter for general adult medical examination with abnormal findings: Secondary | ICD-10-CM

## 2023-06-23 DIAGNOSIS — Z0000 Encounter for general adult medical examination without abnormal findings: Secondary | ICD-10-CM | POA: Diagnosis not present

## 2023-06-23 DIAGNOSIS — C180 Malignant neoplasm of cecum: Secondary | ICD-10-CM | POA: Diagnosis not present

## 2023-06-23 DIAGNOSIS — E785 Hyperlipidemia, unspecified: Secondary | ICD-10-CM | POA: Diagnosis not present

## 2023-06-23 DIAGNOSIS — N183 Chronic kidney disease, stage 3 unspecified: Secondary | ICD-10-CM | POA: Diagnosis not present

## 2023-06-23 DIAGNOSIS — Z780 Asymptomatic menopausal state: Secondary | ICD-10-CM

## 2023-06-23 NOTE — Progress Notes (Signed)
Subjective:    Patient ID: Amy Powell, female    DOB: 12-Dec-1945, 77 y.o.   MRN: [id number]  HPI  Amy Powell is a 77yo female with a h/o cecal adenocarcinoma who was admitted to [facility] and underwent Laparoscopic right hemicolectomy, small bowel resection on  11/21/21. Had colonoscopy which was clear in 3/24.  Due for repeat colonoscopy in 2027 per GI recs.  Mammogram performed 3/24 was normal.  Due for DEXA.  Appears to be due for Shingrix, flu, covid, and rsv immunizations.  She does appear to be due for Prevnar 20.  She refuses a flu shot today.  She declines the shingles vaccine.  She will get COVID at her pharmacy. Immunization History  Administered Date(s) Administered   Janssen (J&J) SARS-COV-2 Vaccination 01/09/2020   Moderna Sars-Covid-2 Vaccination 08/28/2020   Pneumococcal Polysaccharide-23 10/24/2019  She denies any depression.  She denies any falls.  She denies any memory loss.  Her blood pressure today is outstanding at 130/70  Past Medical History:  Diagnosis Date   Cancer (HCC)    colon   CKD (chronic kidney disease) stage 3, GFR 30-59 ml/min (HCC)    HLD (hyperlipidemia)    Osteoporosis    Past Surgical History:  Procedure Laterality Date   ABDOMINAL HYSTERECTOMY     for fibroids, [facility]   LAPAROSCOPIC RIGHT HEMI COLECTOMY Right 11/21/2021   Procedure: LAPAROSCOPIC RIGHT HEMI COLECTOMY, SMALL BOWEL RESECTION;  Surgeon: Andria Meuse, MD;  Location: [facility];  Service: General;  Laterality: Right;   TONSILLECTOMY      Current Outpatient Medications on File Prior to Visit  Medication Sig Dispense Refill   Cholecalciferol (VITAMIN D3) 125 MCG (5000 UT) TBDP Take 5,000 Units by mouth daily at 2 PM.     linaclotide (LINZESS) 145 MCG CAPS capsule Take 1 capsule (145 mcg total) by mouth daily before breakfast. (Patient not taking: Reported on 11/18/2022) 30 capsule 11   senna (SENOKOT) 8.6 MG tablet Take 1 tablet by mouth as needed for constipation.     No  current facility-administered medications on file prior to visit.   Allergies  Allergen Reactions   Penicillins Itching    Has patient had a PCN reaction causing immediate rash, facial/tongue/throat swelling, SOB or lightheadedness with hypotension: Y Has patient had a PCN reaction causing severe rash involving mucus membranes or skin necrosis: Y Has patient had a PCN reaction that required hospitalization: N Has patient had a PCN reaction occurring within the last 10 years: N If all of the above answers are "NO", then may proceed with Cephalosporin use.    Social History   Socioeconomic History   Marital status: Single    Spouse name: Not on file   Number of children: Not on file   Years of education: Not on file   Highest education level: GED or equivalent  Occupational History   Not on file  Tobacco Use   Smoking status: Never   Smokeless tobacco: Never  Vaping Use   Vaping status: Never Used  Substance and Sexual Activity   Alcohol use: Yes    Comment: rarely   Drug use: No   Sexual activity: Not on file    Comment: Works at [employer].  Other Topics Concern   Not on file  Social History Narrative   Not on file   Social Determinants of Health   Financial Resource Strain: Low Risk  (06/19/2023)   Overall Financial Resource Strain (CARDIA)    Difficulty  of Paying Living Expenses: Not hard at all  Food Insecurity: No Food Insecurity (06/19/2023)   Hunger Vital Sign    Worried About Running Out of Food in the Last Year: Never true    Ran Out of Food in the Last Year: Never true  Transportation Needs: No Transportation Needs (06/19/2023)   PRAPARE - Administrator, Civil Service (Medical): No    Lack of Transportation (Non-Medical): No  Physical Activity: Sufficiently Active (06/19/2023)   Exercise Vital Sign    Days of Exercise per Week: 7 days    Minutes of Exercise per Session: 30 min  Stress: No Stress Concern Present (06/19/2023)   [employer] of  Occupational Health - Occupational Stress Questionnaire    Feeling of Stress : Not at all  Social Connections: Moderately Integrated (06/19/2023)   Social Connection and Isolation Panel [NHANES]    Frequency of Communication with Friends and Family: More than three times a week    Frequency of Social Gatherings with Friends and Family: More than three times a week    Attends Religious Services: More than 4 times per year    Active Member of [employer] or Organizations: No    Attends Engineer, structural: More than 4 times per year    Marital Status: Never married  Intimate Partner Violence: Not At Risk (09/09/2022)   Humiliation, Afraid, Rape, and Kick questionnaire    Fear of Current or Ex-Partner: No    Emotionally Abused: No    Physically Abused: No    Sexually Abused: No    Family History  Problem Relation Age of Onset   Hypertension Mother    Stroke Mother    Diabetes Mother    Alcohol abuse Father    Hypertension Father    Kidney disease Sister    Heart disease Sister    Hypertension Sister    Diabetes Sister    Lung cancer Sister    Heart disease Brother    Heart disease Brother    Breast cancer Neg Hx    Colon cancer Neg Hx    Esophageal cancer Neg Hx    Pancreatic cancer Neg Hx    Stomach cancer Neg Hx    Colon polyps Neg Hx    Rectal cancer Neg Hx     Review of Systems  All other systems reviewed and are negative.      Objective:   Physical Exam Vitals reviewed.  Constitutional:      General: She is not in acute distress.    Appearance: Normal appearance. She is well-developed and normal weight. She is not ill-appearing, toxic-appearing or diaphoretic.  HENT:     Head: Normocephalic and atraumatic.     Right Ear: Tympanic membrane and ear canal normal.     Left Ear: Tympanic membrane and ear canal normal.     Nose: Nose normal. No congestion or rhinorrhea.     Mouth/Throat:     Mouth: Mucous membranes are moist.     Pharynx: Oropharynx is clear.  No oropharyngeal exudate or posterior oropharyngeal erythema.  Eyes:     General: No scleral icterus.    Extraocular Movements: Extraocular movements intact.     Conjunctiva/sclera: Conjunctivae normal.     Pupils: Pupils are equal, round, and reactive to light.  Neck:     Thyroid: No thyromegaly.     Vascular: No carotid bruit or JVD.     Trachea: No tracheal deviation.  Cardiovascular:  Rate and Rhythm: Normal rate and regular rhythm.     Heart sounds: Normal heart sounds. No murmur heard.    No friction rub. No gallop.  Pulmonary:     Effort: Pulmonary effort is normal. No respiratory distress.     Breath sounds: Normal breath sounds. No stridor. No wheezing or rales.  Chest:     Chest wall: No tenderness.  Abdominal:     General: Bowel sounds are normal. There is no distension.     Palpations: Abdomen is soft. There is no mass.     Tenderness: There is no abdominal tenderness. There is no guarding or rebound.  Musculoskeletal:     Right lower leg: No edema.     Left lower leg: No edema.  Lymphadenopathy:     Cervical: No cervical adenopathy.  Skin:    General: Skin is warm.     Coloration: Skin is not jaundiced.     Findings: No bruising, erythema, lesion or rash.  Neurological:     General: No focal deficit present.     Mental Status: She is alert and oriented to person, place, and time. Mental status is at baseline.     Cranial Nerves: No cranial nerve deficit.     Sensory: No sensory deficit.     Motor: No weakness or abnormal muscle tone.     Coordination: Coordination normal.     Gait: Gait normal.     Deep Tendon Reflexes: Reflexes are normal and symmetric. Reflexes normal.  Psychiatric:        Mood and Affect: Mood normal.        Behavior: Behavior normal.        Thought Content: Thought content normal.        Judgment: Judgment normal.        Assessment & Plan:   Encounter for Medicare annual wellness exam - Plan: CBC with Differential/Platelet,  COMPLETE METABOLIC PANEL WITH GFR, Lipid panel  Adenocarcinoma of cecum (HCC) - Plan: CBC with Differential/Platelet, COMPLETE METABOLIC PANEL WITH GFR, Lipid panel  Screening cholesterol level - Plan: CBC with Differential/Platelet, COMPLETE METABOLIC PANEL WITH GFR, Lipid panel  Postmenopausal estrogen deficiency - Plan: DG Bone Density Physical exam today is completely normal.  Blood pressure is excellent.  I recommended a flu shot, shingles shot.  Patient declines these.  Send on the 20.  She had repeat shot at her pharmacy.  Mammogram and colonoscopy are up-to-date.  Due to age she does not require Pap smear.  I will schedule the patient for a bone density test and I recommended 1200 mg a day of calcium and 1000 units a day of vitamin D.  Patient denies any memory loss, falls, depression.  Check CBC CMP and a fasting lipid panel

## 2023-06-23 NOTE — Addendum Note (Signed)
Addended by: Venia Carbon K on: 06/23/2023 09:06 AM   Modules accepted: Orders

## 2023-06-24 LAB — CBC WITH DIFFERENTIAL/PLATELET
Absolute Monocytes: 697 cells/uL (ref 200–950)
Basophils Absolute: 33 cells/uL (ref 0–200)
Basophils Relative: 0.4 %
Eosinophils Absolute: 100 cells/uL (ref 15–500)
Eosinophils Relative: 1.2 %
HCT: 41.7 % (ref 35.0–45.0)
Hemoglobin: 13.3 g/dL (ref 11.7–15.5)
Lymphs Abs: 1901 cells/uL (ref 850–3900)
MCH: 26.2 pg — ABNORMAL LOW (ref 27.0–33.0)
MCHC: 31.9 g/dL — ABNORMAL LOW (ref 32.0–36.0)
MCV: 82.1 fL (ref 80.0–100.0)
MPV: 9.4 fL (ref 7.5–12.5)
Monocytes Relative: 8.4 %
Neutro Abs: 5569 cells/uL (ref 1500–7800)
Neutrophils Relative %: 67.1 %
Platelets: 166 10*3/uL (ref 140–400)
RBC: 5.08 10*6/uL (ref 3.80–5.10)
RDW: 14 % (ref 11.0–15.0)
Total Lymphocyte: 22.9 %
WBC: 8.3 10*3/uL (ref 3.8–10.8)

## 2023-06-24 LAB — COMPLETE METABOLIC PANEL WITH GFR
AG Ratio: 1.5 (calc) (ref 1.0–2.5)
ALT: 9 U/L (ref 6–29)
AST: 13 U/L (ref 10–35)
Albumin: 4 g/dL (ref 3.6–5.1)
Alkaline phosphatase (APISO): 57 U/L (ref 37–153)
BUN/Creatinine Ratio: 12 (calc) (ref 6–22)
BUN: 15 mg/dL (ref 7–25)
CO2: 27 mmol/L (ref 20–32)
Calcium: 9 mg/dL (ref 8.6–10.4)
Chloride: 106 mmol/L (ref 98–110)
Creat: 1.23 mg/dL — ABNORMAL HIGH (ref 0.60–1.00)
Globulin: 2.6 g/dL (calc) (ref 1.9–3.7)
Glucose, Bld: 85 mg/dL (ref 65–99)
Potassium: 4.6 mmol/L (ref 3.5–5.3)
Sodium: 141 mmol/L (ref 135–146)
Total Bilirubin: 1 mg/dL (ref 0.2–1.2)
Total Protein: 6.6 g/dL (ref 6.1–8.1)
eGFR: 45 mL/min/{1.73_m2} — ABNORMAL LOW (ref >=60)

## 2023-06-24 LAB — LIPID PANEL
Cholesterol: 211 mg/dL — ABNORMAL HIGH (ref <200)
HDL: 61 mg/dL (ref >=50)
LDL Cholesterol (Calc): 125 mg/dL (calc) — ABNORMAL HIGH
Non-HDL Cholesterol (Calc): 150 mg/dL (calc) — ABNORMAL HIGH (ref <130)
Total CHOL/HDL Ratio: 3.5 (calc) (ref <5.0)
Triglycerides: 132 mg/dL (ref <150)

## 2023-07-18 DIAGNOSIS — Z961 Presence of intraocular lens: Secondary | ICD-10-CM | POA: Diagnosis not present

## 2023-07-18 DIAGNOSIS — H26493 Other secondary cataract, bilateral: Secondary | ICD-10-CM | POA: Diagnosis not present

## 2023-07-18 DIAGNOSIS — H524 Presbyopia: Secondary | ICD-10-CM | POA: Diagnosis not present

## 2023-09-24 ENCOUNTER — Ambulatory Visit (INDEPENDENT_AMBULATORY_CARE_PROVIDER_SITE_OTHER): Payer: Medicare HMO | Admitting: *Deleted

## 2023-09-24 DIAGNOSIS — Z0000 Encounter for general adult medical examination without abnormal findings: Secondary | ICD-10-CM | POA: Diagnosis not present

## 2023-09-24 NOTE — Patient Instructions (Signed)
[name] , Thank you for taking time to come for your Medicare Wellness Visit. I appreciate your ongoing commitment to your health goals. Please review the following plan we discussed and let me know if I can assist you in the future.   Screening recommendations/referrals: Colonoscopy:  Mammogram:  Bone Density:  Recommended yearly ophthalmology/optometry visit for glaucoma screening and checkup Recommended yearly dental visit for hygiene and checkup  Vaccinations: Influenza vaccine:  Pneumococcal vaccine:  Tdap vaccine:  Shingles vaccine:       Preventive Care 65 Years and Older, Female Preventive care refers to lifestyle choices and visits with your health care provider that can promote health and wellness. What does preventive care include? A yearly physical exam. This is also called an annual well check. Dental exams once or twice a year. Routine eye exams. Ask your health care provider how often you should have your eyes checked. Personal lifestyle choices, including: Daily care of your teeth and gums. Regular physical activity. Eating a healthy diet. Avoiding tobacco and drug use. Limiting alcohol use. Practicing safe sex. Taking low-dose aspirin every day. Taking vitamin and mineral supplements as recommended by your health care provider. What happens during an annual well check? The services and screenings done by your health care provider during your annual well check will depend on your age, overall health, lifestyle risk factors, and family history of disease. Counseling  Your health care provider may ask you questions about your: Alcohol use. Tobacco use. Drug use. Emotional well-being. Home and relationship well-being. Sexual activity. Eating habits. History of falls. Memory and ability to understand (cognition). Work and work Astronomer. Reproductive health. Screening  You may have the following tests or measurements: Height, weight, and  BMI. Blood pressure. Lipid and cholesterol levels. These may be checked every 5 years, or more frequently if you are over 40 years old. Skin check. Lung cancer screening. You may have this screening every year starting at age 12 if you have a 30-pack-year history of smoking and currently smoke or have quit within the past 15 years. Fecal occult blood test (FOBT) of the stool. You may have this test every year starting at age 38. Flexible sigmoidoscopy or colonoscopy. You may have a sigmoidoscopy every 5 years or a colonoscopy every 10 years starting at age 83. Hepatitis C blood test. Hepatitis B blood test. Sexually transmitted disease (STD) testing. Diabetes screening. This is done by checking your blood sugar (glucose) after you have not eaten for a while (fasting). You may have this done every 1-3 years. Bone density scan. This is done to screen for osteoporosis. You may have this done starting at age 72. Mammogram. This may be done every 1-2 years. Talk to your health care provider about how often you should have regular mammograms. Talk with your health care provider about your test results, treatment options, and if necessary, the need for more tests. Vaccines  Your health care provider may recommend certain vaccines, such as: Influenza vaccine. This is recommended every year. Tetanus, diphtheria, and acellular pertussis (Tdap, Td) vaccine. You may need a Td booster every 10 years. Zoster vaccine. You may need this after age 21. Pneumococcal 13-valent conjugate (PCV13) vaccine. One dose is recommended after age 76. Pneumococcal polysaccharide (PPSV23) vaccine. One dose is recommended after age 44. Talk to your health care provider about which screenings and vaccines you need and how often you need them. This information is not intended to replace advice given to you by your health care  provider. Make sure you discuss any questions you have with your health care provider. Document  Released: 10/12/2015 Document Revised: 06/04/2016 Document Reviewed: 07/17/2015 Elsevier Interactive Patient Education  2017 [employer].  Fall Prevention in the Home Falls can cause injuries. They can happen to people of all ages. There are many things you can do to make your home safe and to help prevent falls. What can I do on the outside of my home? Regularly fix the edges of walkways and driveways and fix any cracks. Remove anything that might make you trip as you walk through a door, such as a raised step or threshold. Trim any bushes or trees on the path to your home. Use bright outdoor lighting. Clear any walking paths of anything that might make someone trip, such as rocks or tools. Regularly check to see if handrails are loose or broken. Make sure that both sides of any steps have handrails. Any raised decks and porches should have guardrails on the edges. Have any leaves, snow, or ice cleared regularly. Use sand or salt on walking paths during winter. Clean up any spills in your garage right away. This includes oil or grease spills. What can I do in the bathroom? Use night lights. Install grab bars by the toilet and in the tub and shower. Do not use towel bars as grab bars. Use non-skid mats or decals in the tub or shower. If you need to sit down in the shower, use a plastic, non-slip stool. Keep the floor dry. Clean up any water that spills on the floor as soon as it happens. Remove soap buildup in the tub or shower regularly. Attach bath mats securely with double-sided non-slip rug tape. Do not have throw rugs and other things on the floor that can make you trip. What can I do in the bedroom? Use night lights. Make sure that you have a light by your bed that is easy to reach. Do not use any sheets or blankets that are too big for your bed. They should not hang down onto the floor. Have a firm chair that has side arms. You can use this for support while you get dressed. Do  not have throw rugs and other things on the floor that can make you trip. What can I do in the kitchen? Clean up any spills right away. Avoid walking on wet floors. Keep items that you use a lot in easy-to-reach places. If you need to reach something above you, use a strong step stool that has a grab bar. Keep electrical cords out of the way. Do not use floor polish or wax that makes floors slippery. If you must use wax, use non-skid floor wax. Do not have throw rugs and other things on the floor that can make you trip. What can I do with my stairs? Do not leave any items on the stairs. Make sure that there are handrails on both sides of the stairs and use them. Fix handrails that are broken or loose. Make sure that handrails are as long as the stairways. Check any carpeting to make sure that it is firmly attached to the stairs. Fix any carpet that is loose or worn. Avoid having throw rugs at the top or bottom of the stairs. If you do have throw rugs, attach them to the floor with carpet tape. Make sure that you have a light switch at the top of the stairs and the bottom of the stairs. If you do not have  them, ask someone to add them for you. What else can I do to help prevent falls? Wear shoes that: Do not have high heels. Have rubber bottoms. Are comfortable and fit you well. Are closed at the toe. Do not wear sandals. If you use a stepladder: Make sure that it is fully opened. Do not climb a closed stepladder. Make sure that both sides of the stepladder are locked into place. Ask someone to hold it for you, if possible. Clearly mark and make sure that you can see: Any grab bars or handrails. First and last steps. Where the edge of each step is. Use tools that help you move around (mobility aids) if they are needed. These include: Canes. Walkers. Scooters. Crutches. Turn on the lights when you go into a dark area. Replace any light bulbs as soon as they burn out. Set up your  furniture so you have a clear path. Avoid moving your furniture around. If any of your floors are uneven, fix them. If there are any pets around you, be aware of where they are. Review your medicines with your doctor. Some medicines can make you feel dizzy. This can increase your chance of falling. Ask your doctor what other things that you can do to help prevent falls. This information is not intended to replace advice given to you by your health care provider. Make sure you discuss any questions you have with your health care provider. Document Released: 07/12/2009 Document Revised: 02/21/2016 Document Reviewed: 10/20/2014 Elsevier Interactive Patient Education  2017 [employer].

## 2023-09-24 NOTE — Progress Notes (Signed)
Subjective:   Amy Powell is a 77 y.o. female who presents for Medicare Annual (Subsequent) preventive examination.  Visit Complete: Virtual I connected with  Silvana Newness on 09/24/23 by a audio enabled telemedicine application and verified that I am speaking with the correct person using two identifiers.  Patient Location: Home  Provider Location: Home Office  I discussed the limitations of evaluation and management by telemedicine. The patient expressed understanding and agreed to proceed.  Vital Signs: Because this visit was a virtual/telehealth visit, some criteria may be missing or patient reported. Any vitals not documented were not able to be obtained and vitals that have been documented are patient reported.  Patient Medicare AWV questionnaire was completed by the patient on 09-18-2023; I have confirmed that all information answered by patient is correct and no changes since this date.  Cardiac Risk Factors include: advanced age (>93men, >40 women);family history of premature cardiovascular disease     Objective:    There were no vitals filed for this visit. There is no height or weight on file to calculate BMI.     09/24/2023   12:18 PM 11/18/2022   11:20 AM 09/09/2022    1:20 PM 11/21/2021   11:14 AM 07/19/2021   12:52 AM 03/28/2021    8:30 AM 09/23/2018   10:16 AM  Advanced Directives  Does Patient Have a Medical Advance Directive? No Yes No No No No No  Type of Advance Directive  Healthcare Power of Attorney       Does patient want to make changes to medical advance directive?  No - Patient declined    No - Patient declined   Copy of Healthcare Power of Attorney in Chart?  No - copy requested       Would patient like information on creating a medical advance directive?   Yes (MAU/Ambulatory/Procedural Areas - Information given) No - Patient declined   Yes (MAU/Ambulatory/Procedural Areas - Information given)    Current Medications  (verified) Outpatient Encounter Medications as of 09/24/2023  Medication Sig   ascorbic acid (VITAMIN C) 500 MG tablet Take 500 mg by mouth daily.   bisacodyl (DULCOLAX) 5 MG EC tablet Take 5 mg by mouth daily as needed for moderate constipation.   Cholecalciferol (VITAMIN D3) 125 MCG (5000 UT) TBDP Take 5,000 Units by mouth daily at 2 PM. (Patient not taking: Reported on 06/23/2023)   linaclotide (LINZESS) 145 MCG CAPS capsule Take 1 capsule (145 mcg total) by mouth daily before breakfast. (Patient not taking: Reported on 09/24/2023)   senna (SENOKOT) 8.6 MG tablet Take 1 tablet by mouth as needed for constipation. (Patient not taking: Reported on 09/24/2023)   No facility-administered encounter medications on file as of 09/24/2023.    Allergies (verified) Penicillins   History: Past Medical History:  Diagnosis Date   Cancer (HCC)    colon   CKD (chronic kidney disease) stage 3, GFR 30-59 ml/min (HCC)    HLD (hyperlipidemia)    Osteoporosis    Past Surgical History:  Procedure Laterality Date   ABDOMINAL HYSTERECTOMY     for fibroids, [facility]AHBSO   LAPAROSCOPIC RIGHT HEMI COLECTOMY Right 11/21/2021   Procedure: LAPAROSCOPIC RIGHT HEMI COLECTOMY, SMALL BOWEL RESECTION;  Surgeon: Andria Meuse, MD;  Location: [facility];  Service: General;  Laterality: Right;   TONSILLECTOMY     Family History  Problem Relation Age of Onset   Hypertension Mother    Stroke Mother    Diabetes Mother  Alcohol abuse Father    Hypertension Father    Kidney disease Sister    Heart disease Sister    Hypertension Sister    Diabetes Sister    Lung cancer Sister    Heart disease Brother    Heart disease Brother    Breast cancer Neg Hx    Colon cancer Neg Hx    Esophageal cancer Neg Hx    Pancreatic cancer Neg Hx    Stomach cancer Neg Hx    Colon polyps Neg Hx    Rectal cancer Neg Hx    Social History   Socioeconomic History   Marital status: Single    Spouse name: Not on file   Number of  children: Not on file   Years of education: Not on file   Highest education level: GED or equivalent  Occupational History   Not on file  Tobacco Use   Smoking status: Never   Smokeless tobacco: Never  Vaping Use   Vaping status: Never Used  Substance and Sexual Activity   Alcohol use: Yes    Comment: rarely   Drug use: No   Sexual activity: Not Currently    Comment: Works at [employer].  Other Topics Concern   Not on file  Social History Narrative   Not on file   Social Drivers of Health   Financial Resource Strain: Low Risk  (09/24/2023)   Overall Financial Resource Strain (CARDIA)    Difficulty of Paying Living Expenses: Not hard at all  Food Insecurity: No Food Insecurity (09/24/2023)   Hunger Vital Sign    Worried About Running Out of Food in the Last Year: Never true    Ran Out of Food in the Last Year: Never true  Transportation Needs: No Transportation Needs (09/24/2023)   PRAPARE - Administrator, Civil Service (Medical): No    Lack of Transportation (Non-Medical): No  Physical Activity: Sufficiently Active (09/24/2023)   Exercise Vital Sign    Days of Exercise per Week: 7 days    Minutes of Exercise per Session: 30 min  Stress: No Stress Concern Present (09/24/2023)   [employer] of Occupational Health - Occupational Stress Questionnaire    Feeling of Stress : Not at all  Social Connections: Moderately Integrated (09/24/2023)   Social Connection and Isolation Panel [NHANES]    Frequency of Communication with Friends and Family: More than three times a week    Frequency of Social Gatherings with Friends and Family: More than three times a week    Attends Religious Services: More than 4 times per year    Active Member of [employer] or Organizations: No    Attends Engineer, structural: More than 4 times per year    Marital Status: Never married    Tobacco Counseling Counseling given: Not Answered   Clinical Intake:  Pre-visit  preparation completed: No  Pain : No/denies pain     Diabetes: No  How often do you need to have someone help you when you read instructions, pamphlets, or other written materials from your doctor or pharmacy?: 1 - Never  Interpreter Needed?: No  Information entered by :: Remi Haggard LPN   Activities of Daily Living    09/24/2023   12:16 PM 09/18/2023    3:42 PM  In your present state of health, do you have any difficulty performing the following activities:  Vision? 0 0  Difficulty concentrating or making decisions? 0   Walking or climbing stairs? 0  0  Dressing or bathing? 0 0  Doing errands, shopping? 0 0  Preparing Food and eating ? N N  Using the Toilet? N N  In the past six months, have you accidently leaked urine? N N  Do you have problems with loss of bowel control? N N  Managing your Medications? N N  Managing your Finances? N N  Housekeeping or managing your Housekeeping? N N    Patient Care Team: Donita Brooks, MD as PCP - General (Family Medicine)  Indicate any recent Medical Services you may have received from other than [facility] providers in the past year (date may be approximate).     Assessment:   This is a routine wellness examination for [facility].  Hearing/Vision screen Hearing Screening - Comments:: No trouble hearing Vision Screening - Comments:: Up to date Unsure of name   Goals Addressed             This Visit's Progress    Patient Stated       Continue current lifestyle     Prevent falls   On track      Depression Screen    09/24/2023   12:20 PM 06/23/2023    8:30 AM 10/24/2022    8:58 AM 10/21/2022    8:07 AM 09/09/2022    1:19 PM 03/28/2021    8:33 AM 01/18/2021   11:44 AM  PHQ 2/9 Scores  PHQ - 2 Score 0 0 0 0 0 0 0  PHQ- 9 Score 0          Fall Risk    09/24/2023   12:15 PM 09/18/2023    3:42 PM 06/23/2023    8:30 AM 10/24/2022    8:58 AM 10/21/2022    8:07 AM  Fall Risk   Falls in the past year? 0 0 0 0 0  Number  falls in past yr: 0  0 0 0  Injury with Fall? 0  0 0 0  Risk for fall due to :   No Fall Risks No Fall Risks No Fall Risks  Follow up Falls evaluation completed;Education provided;Falls prevention discussed  Falls prevention discussed Falls prevention discussed Falls prevention discussed    MEDICARE RISK AT HOME: Medicare Risk at Home Any stairs in or around the home?: Yes If so, are there any without handrails?: No Home free of loose throw rugs in walkways, pet beds, electrical cords, etc?: Yes Adequate lighting in your home to reduce risk of falls?: Yes Life alert?: No Use of a cane, walker or w/c?: No Grab bars in the bathroom?: Yes Shower chair or bench in shower?: No Elevated toilet seat or a handicapped toilet?: No  TIMED UP AND GO:  Was the test performed?  No    Cognitive Function:        09/24/2023   12:16 PM 09/09/2022    1:21 PM 03/28/2021    8:35 AM  6CIT Screen  What Year? 0 points 0 points 0 points  What month? 0 points 0 points 0 points  What time?  0 points 0 points  Count back from 20  0 points 0 points  Months in reverse  0 points 0 points  Repeat phrase  0 points 4 points  Total Score  0 points 4 points    Immunizations Immunization History  Administered Date(s) Administered   Janssen (J&J) SARS-COV-2 Vaccination 01/09/2020   Moderna Sars-Covid-2 Vaccination 08/28/2020   PNEUMOCOCCAL CONJUGATE-20 06/23/2023   Pneumococcal Polysaccharide-23 10/24/2019  TDAP status: Due, Education has been provided regarding the importance of this vaccine. Advised may receive this vaccine at local pharmacy or Health Dept. Aware to provide a copy of the vaccination record if obtained from local pharmacy or Health Dept. Verbalized acceptance and understanding.  Flu Vaccine status: Declined, Education has been provided regarding the importance of this vaccine but patient still declined. Advised may receive this vaccine at local pharmacy or Health Dept. Aware to  provide a copy of the vaccination record if obtained from local pharmacy or Health Dept. Verbalized acceptance and understanding.  Pneumococcal vaccine status: Up to date  Covid-19 vaccine status: Information provided on how to obtain vaccines.   Qualifies for Shingles Vaccine? Yes   Zostavax completed No   Shingrix Completed?: No.    Education has been provided regarding the importance of this vaccine. Patient has been advised to call insurance company to determine out of pocket expense if they have not yet received this vaccine. Advised may also receive vaccine at local pharmacy or Health Dept. Verbalized acceptance and understanding.  Screening Tests Health Maintenance  Topic Date Due   COVID-19 Vaccine (3 - 2024-25 season) 09/25/2023 (Originally 05/31/2023)   Hepatitis C Screening  10/22/2023 (Originally 12/31/1963)   Zoster Vaccines- Shingrix (1 of 2) 12/23/2023 (Originally 12/30/1964)   INFLUENZA VACCINE  12/28/2023 (Originally 04/30/2023)   Medicare Annual Wellness (AWV)  09/23/2024   Pneumonia Vaccine 66+ Years old  Completed   DEXA SCAN  Completed   HPV VACCINES  Aged Out   DTaP/Tdap/Td  Discontinued   Fecal DNA (Cologuard)  Discontinued    Health Maintenance  There are no preventive care reminders to display for this patient.   Colorectal cancer screening: No longer required.   Mammogram status: Completed  . Repeat every year  Bone Density scheduled 12-2023  Lung Cancer Screening: (Low Dose CT Chest recommended if Age 76-80 years, 20 pack-year currently smoking OR have quit w/in 15years.) does not qualify.   Lung Cancer Screening Referral:   Additional Screening:  Hepatitis C Screening:  never done  Vision Screening: Recommended annual ophthalmology exams for early detection of glaucoma and other disorders of the eye. Is the patient up to date with their annual eye exam?  Yes  Who is the provider or what is the name of the office in which the patient attends annual eye  exams? Unsure of name If pt is not established with a provider, would they like to be referred to a provider to establish care? No .   Dental Screening: Recommended annual dental exams for proper oral hygiene    Community Resource Referral / Chronic Care Management: CRR required this visit?  No   CCM required this visit?  No     Plan:     I have personally reviewed and noted the following in the patient's chart:   Medical and social history Use of alcohol, tobacco or illicit drugs  Current medications and supplements including opioid prescriptions. Patient is not currently taking opioid prescriptions. Functional ability and status Nutritional status Physical activity Advanced directives List of other physicians Hospitalizations, surgeries, and ER visits in previous 12 months Vitals Screenings to include cognitive, depression, and falls Referrals and appointments  In addition, I have reviewed and discussed with patient certain preventive protocols, quality metrics, and best practice recommendations. A written personalized care plan for preventive services as well as general preventive health recommendations were provided to patient.     Remi Haggard, LPN   [id number]  After Visit Summary: (MyChart) Due to this being a telephonic visit, the after visit summary with patients personalized plan was offered to patient via MyChart   Nurse Notes:

## 2024-01-15 ENCOUNTER — Ambulatory Visit
Admission: RE | Admit: 2024-01-15 | Discharge: 2024-01-15 | Disposition: A | Discharge status: Home / Self Care | Payer: PRIVATE HEALTH INSURANCE | Source: Ambulatory Visit | Attending: Family Medicine | Admitting: Family Medicine

## 2024-01-15 DIAGNOSIS — Z780 Asymptomatic menopausal state: Secondary | ICD-10-CM

## 2024-01-15 DIAGNOSIS — M810 Age-related osteoporosis without current pathological fracture: Secondary | ICD-10-CM | POA: Diagnosis not present

## 2024-01-18 ENCOUNTER — Other Ambulatory Visit: Payer: Self-pay

## 2024-01-18 DIAGNOSIS — M810 Age-related osteoporosis without current pathological fracture: Secondary | ICD-10-CM

## 2024-01-18 MED ORDER — ALENDRONATE SODIUM 70 MG PO TABS
70.0000 mg | ORAL_TABLET | ORAL | 11 refills | Status: DC
Start: 2024-01-18 — End: 2024-02-19

## 2024-02-19 ENCOUNTER — Telehealth: Payer: Self-pay

## 2024-02-19 ENCOUNTER — Other Ambulatory Visit: Payer: Self-pay

## 2024-02-19 DIAGNOSIS — M810 Age-related osteoporosis without current pathological fracture: Secondary | ICD-10-CM

## 2024-02-19 MED ORDER — ALENDRONATE SODIUM 70 MG PO TABS: 70.0000 mg | ORAL_TABLET | ORAL | 11 refills | Status: AC

## 2024-02-19 NOTE — Telephone Encounter (Signed)
 Prescription Request  02/19/2024  LOV: 09/24/23  What is the name of the medication or equipment? alendronate  (FOSAMAX ) 70 MG tablet [[id number]]   Have you contacted your pharmacy to request a refill? Yes   Which pharmacy would you like this sent to?  [facility] - [place], [place] - [address] [place] [address] Phone: [phone] Fax: [phone]    Patient notified that their request is being sent to the clinical staff for review and that they should receive a response within 2 business days.   Please advise at [facility] [phone]

## 2024-02-23 ENCOUNTER — Other Ambulatory Visit: Payer: Self-pay | Admitting: Family Medicine

## 2024-02-23 DIAGNOSIS — M810 Age-related osteoporosis without current pathological fracture: Secondary | ICD-10-CM

## 2024-02-23 NOTE — Telephone Encounter (Signed)
 Copied from CRM [id number]. Topic: Clinical - Medication Refill >> Feb 23, 2024  7:44 AM Rosaria Common wrote: Medication: alendronate  (FOSAMAX ) 70 MG tablet  Has the patient contacted their pharmacy? Yes (Agent: If no, request that the patient contact the pharmacy for the refill. If patient does not wish to contact the pharmacy document the reason why and proceed with request.) (Agent: If yes, when and what did the pharmacy advise?)  This is the patient's preferred pharmacy:  [facility] - [place], [place] - [address] [place] [address] Phone: [phone] Fax: [phone]  Is this the correct pharmacy for this prescription? Yes If no, delete pharmacy and type the correct one.   Has the prescription been filled recently? Yes  Is the patient out of the medication? Yes  Has the patient been seen for an appointment in the last year OR does the patient have an upcoming appointment? Yes  Can we respond through MyChart? Yes  Agent: Please be advised that Rx refills may take up to 3 business days. We ask that you follow-up with your pharmacy.

## 2024-06-07 ENCOUNTER — Ambulatory Visit: Admitting: Family Medicine

## 2024-06-07 ENCOUNTER — Encounter: Payer: Self-pay | Admitting: Family Medicine

## 2024-06-07 VITALS — BP 132/70 | HR 59 | Temp 97.8°F | Ht 66.0 in | Wt 133.8 lb

## 2024-06-07 DIAGNOSIS — Z0000 Encounter for general adult medical examination without abnormal findings: Secondary | ICD-10-CM

## 2024-06-07 DIAGNOSIS — C180 Malignant neoplasm of cecum: Secondary | ICD-10-CM | POA: Diagnosis not present

## 2024-06-07 DIAGNOSIS — Z1231 Encounter for screening mammogram for malignant neoplasm of breast: Secondary | ICD-10-CM

## 2024-06-07 DIAGNOSIS — Z0001 Encounter for general adult medical examination with abnormal findings: Secondary | ICD-10-CM

## 2024-06-07 DIAGNOSIS — M810 Age-related osteoporosis without current pathological fracture: Secondary | ICD-10-CM | POA: Diagnosis not present

## 2024-06-07 NOTE — Progress Notes (Signed)
 Subjective:    Patient ID: Amy Powell, female    DOB: 1946/01/15, 78 y.o.   MRN: [id number]  HPI  Amy Powell is a 78yo female with a h/o cecal adenocarcinoma who was admitted to [facility] and underwent Laparoscopic right hemicolectomy, small bowel resection on  11/21/21. Had colonoscopy which was clear in 3/24.  Due for repeat colonoscopy in 2027 per GI recs.  Mammogram is over due.  Last mammogram was 2024.  Does not require pap.  DEXA revealed osteoporosis with T score -3.5 this year.  Currently on fosamax ,  Reminded her to take calcium  1200 ad day and vit d 1000 a day also.   Immunization History  Administered Date(s) Administered   Janssen (J&J) SARS-COV-2 Vaccination 01/09/2020   Moderna Sars-Covid-2 Vaccination 08/28/2020   PNEUMOCOCCAL CONJUGATE-20 06/23/2023   Pneumococcal Polysaccharide-23 10/24/2019  She denies any depression.  She denies any falls.  She denies any memory loss.  Her blood pressure today is outstanding at 132/70.  Her eldest son checks on her almost daily.   Past Medical History:  Diagnosis Date   Cancer (HCC)    colon   CKD (chronic kidney disease) stage 3, GFR 30-59 ml/min (HCC)    HLD (hyperlipidemia)    Osteoporosis    Past Surgical History:  Procedure Laterality Date   ABDOMINAL HYSTERECTOMY     for fibroids, TAHBSO   LAPAROSCOPIC RIGHT HEMI COLECTOMY Right 11/21/2021   Procedure: LAPAROSCOPIC RIGHT HEMI COLECTOMY, SMALL BOWEL RESECTION;  Surgeon: Teresa Lonni HERO, MD;  Location: [facility];  Service: General;  Laterality: Right;   TONSILLECTOMY      Current Outpatient Medications on File Prior to Visit  Medication Sig Dispense Refill   alendronate  (FOSAMAX ) 70 MG tablet Take 1 tablet (70 mg total) by mouth every 7 (seven) days. Take with a full glass of water  on an empty stomach. 4 tablet 11   ascorbic acid  (VITAMIN C) 500 MG tablet Take 500 mg by mouth daily.     bisacodyl  (DULCOLAX) 5 MG EC tablet Take 5 mg by mouth daily as needed for  moderate constipation.     Multiple Vitamins-Minerals (MULTIVITAMIN WITH MINERALS) tablet Take 1 tablet by mouth daily.     Cholecalciferol (VITAMIN D3) 125 MCG (5000 UT) TBDP Take 5,000 Units by mouth daily at 2 PM. (Patient not taking: Reported on 06/07/2024)     linaclotide  (LINZESS ) 145 MCG CAPS capsule Take 1 capsule (145 mcg total) by mouth daily before breakfast. (Patient not taking: Reported on 06/07/2024) 30 capsule 11   senna (SENOKOT) 8.6 MG tablet Take 1 tablet by mouth as needed for constipation. (Patient not taking: Reported on 06/07/2024)     No current facility-administered medications on file prior to visit.   Allergies  Allergen Reactions   Penicillins Itching    Has patient had a PCN reaction causing immediate rash, facial/tongue/throat swelling, SOB or lightheadedness with hypotension: Y Has patient had a PCN reaction causing severe rash involving mucus membranes or skin necrosis: Y Has patient had a PCN reaction that required hospitalization: N Has patient had a PCN reaction occurring within the last 10 years: N If all of the above answers are NO, then may proceed with Cephalosporin use.    Social History   Socioeconomic History   Marital status: Single    Spouse name: Not on file   Number of children: Not on file   Years of education: Not on file   Highest education level: GED or equivalent  Occupational History   Not on file  Tobacco Use   Smoking status: Never   Smokeless tobacco: Never  Vaping Use   Vaping status: Never Used  Substance and Sexual Activity   Alcohol use: Yes    Comment: rarely   Drug use: No   Sexual activity: Not Currently    Comment: Works at [employer].  Other Topics Concern   Not on file  Social History Narrative   Not on file   Social Drivers of Health   Financial Resource Strain: Medium Risk (06/02/2024)   Overall Financial Resource Strain (CARDIA)    Difficulty of Paying Living Expenses: Somewhat hard  Food Insecurity: No Food  Insecurity (06/02/2024)   Hunger Vital Sign    Worried About Running Out of Food in the Last Year: Never true    Ran Out of Food in the Last Year: Never true  Transportation Needs: No Transportation Needs (06/02/2024)   PRAPARE - Administrator, Civil Service (Medical): No    Lack of Transportation (Non-Medical): No  Physical Activity: Insufficiently Active (06/02/2024)   Exercise Vital Sign    Days of Exercise per Week: 3 days    Minutes of Exercise per Session: 40 min  Stress: No Stress Concern Present (06/02/2024)   [employer] of Occupational Health - Occupational Stress Questionnaire    Feeling of Stress: Not at all  Social Connections: Moderately Integrated (06/02/2024)   Social Connection and Isolation Panel    Frequency of Communication with Friends and Family: More than three times a week    Frequency of Social Gatherings with Friends and Family: Twice a week    Attends Religious Services: More than 4 times per year    Active Member of [employer] or Organizations: Yes    Attends Engineer, structural: More than 4 times per year    Marital Status: Never married  Intimate Partner Violence: Not At Risk (09/24/2023)   Humiliation, Afraid, Rape, and Kick questionnaire    Fear of Current or Ex-Partner: No    Emotionally Abused: No    Physically Abused: No    Sexually Abused: No    Family History  Problem Relation Age of Onset   Hypertension Mother    Stroke Mother    Diabetes Mother    Alcohol abuse Father    Hypertension Father    Kidney disease Sister    Heart disease Sister    Hypertension Sister    Diabetes Sister    Lung cancer Sister    Heart disease Brother    Heart disease Brother    Breast cancer Neg Hx    Colon cancer Neg Hx    Esophageal cancer Neg Hx    Pancreatic cancer Neg Hx    Stomach cancer Neg Hx    Colon polyps Neg Hx    Rectal cancer Neg Hx     Review of Systems  All other systems reviewed and are negative.      Objective:    Physical Exam Vitals reviewed.  Constitutional:      General: She is not in acute distress.    Appearance: Normal appearance. She is well-developed and normal weight. She is not ill-appearing, toxic-appearing or diaphoretic.  HENT:     Head: Normocephalic and atraumatic.     Right Ear: Tympanic membrane and ear canal normal.     Left Ear: Tympanic membrane and ear canal normal.     Nose: Nose normal. No congestion or rhinorrhea.  Mouth/Throat:     Mouth: Mucous membranes are moist.     Pharynx: Oropharynx is clear. No oropharyngeal exudate or posterior oropharyngeal erythema.  Eyes:     General: No scleral icterus.    Extraocular Movements: Extraocular movements intact.     Conjunctiva/sclera: Conjunctivae normal.     Pupils: Pupils are equal, round, and reactive to light.  Neck:     Thyroid : No thyromegaly.     Vascular: No carotid bruit or JVD.     Trachea: No tracheal deviation.  Cardiovascular:     Rate and Rhythm: Normal rate and regular rhythm.     Heart sounds: Normal heart sounds. No murmur heard.    No friction rub. No gallop.  Pulmonary:     Effort: Pulmonary effort is normal. No respiratory distress.     Breath sounds: Normal breath sounds. No stridor. No wheezing or rales.  Chest:     Chest wall: No tenderness.  Abdominal:     General: Bowel sounds are normal. There is no distension.     Palpations: Abdomen is soft. There is no mass.     Tenderness: There is no abdominal tenderness. There is no guarding or rebound.  Musculoskeletal:     Right lower leg: No edema.     Left lower leg: No edema.  Lymphadenopathy:     Cervical: No cervical adenopathy.  Skin:    General: Skin is warm.     Coloration: Skin is not jaundiced.     Findings: No bruising, erythema, lesion or rash.  Neurological:     General: No focal deficit present.     Mental Status: She is alert and oriented to person, place, and time. Mental status is at baseline.     Cranial Nerves: No cranial  nerve deficit.     Sensory: No sensory deficit.     Motor: No weakness or abnormal muscle tone.     Coordination: Coordination normal.     Gait: Gait normal.     Deep Tendon Reflexes: Reflexes are normal and symmetric. Reflexes normal.  Psychiatric:        Mood and Affect: Mood normal.        Behavior: Behavior normal.        Thought Content: Thought content normal.        Judgment: Judgment normal.        Assessment & Plan:   Encounter for screening mammogram for malignant neoplasm of breast - Plan: MM 3D SCREENING MAMMOGRAM BILATERAL BREAST  Adenocarcinoma of cecum (HCC)  Age-related osteoporosis without current pathological fracture  General medical exam Schedule patient for a mammogram.  Colonoscopy is due in 2027.  DEXA is utd, take ca 1200 a day, vit d 1000 a day and fosamax  70 mg weekly.  Repeat dexa in 2 years.  Labs show mild HLD but does not require statin.  Stage 3 CKD, avoid nsaids encouraged.  Otherwise, preventative care utd.  Recommended shingrix, rsv, covid, and flu.  She states she will get covid and rsv but declines flu or shingrix.

## 2024-06-10 ENCOUNTER — Telehealth: Payer: Self-pay

## 2024-06-10 ENCOUNTER — Other Ambulatory Visit: Payer: Self-pay

## 2024-06-10 DIAGNOSIS — C180 Malignant neoplasm of cecum: Secondary | ICD-10-CM

## 2024-06-10 DIAGNOSIS — E7849 Other hyperlipidemia: Secondary | ICD-10-CM

## 2024-06-10 DIAGNOSIS — Z23 Encounter for immunization: Secondary | ICD-10-CM

## 2024-06-10 DIAGNOSIS — N183 Chronic kidney disease, stage 3 unspecified: Secondary | ICD-10-CM

## 2024-06-10 DIAGNOSIS — E875 Hyperkalemia: Secondary | ICD-10-CM

## 2024-06-10 MED ORDER — COVID-19 MRNA VACC (MODERNA) 50 MCG/0.5ML IM SUSY: 0.5000 mL | PREFILLED_SYRINGE | Freq: Once | INTRAMUSCULAR | 0 refills | Status: AC

## 2024-06-10 NOTE — Telephone Encounter (Signed)
 Copied from CRM #[id number]. Topic: General - Other >> Jun 10, 2024  4:07 PM Wess RAMAN wrote: Reason for CRM: Patient would like to receive Moderna covid 19 vaccine and needs a prescription sent to the pharmacy  Callback #: [id number]  Pharmacy: [id number] - [place], [place] - [address] AT [facility] OF [address] [place] [address] Phone: [phone] Fax: [phone] Hours: Open 24 hours

## 2024-06-10 NOTE — Telephone Encounter (Signed)
 Copied from CRM #[id number]. Topic: Clinical - Medical Advice >> Jun 10, 2024  1:06 PM Gustabo D wrote: Call [facility] on [address] for covid prescription

## 2024-06-10 NOTE — Progress Notes (Signed)
 ERRONEOUS ENCOUNTER

## 2024-06-23 ENCOUNTER — Ambulatory Visit
Admission: RE | Admit: 2024-06-23 | Discharge: 2024-06-23 | Disposition: A | Discharge status: Home / Self Care | Source: Ambulatory Visit | Attending: Family Medicine | Admitting: Family Medicine

## 2024-06-23 DIAGNOSIS — Z1231 Encounter for screening mammogram for malignant neoplasm of breast: Secondary | ICD-10-CM

## 2024-06-27 DIAGNOSIS — K5900 Constipation, unspecified: Secondary | ICD-10-CM | POA: Diagnosis not present

## 2024-06-27 DIAGNOSIS — Z8249 Family history of ischemic heart disease and other diseases of the circulatory system: Secondary | ICD-10-CM | POA: Diagnosis not present

## 2024-06-27 DIAGNOSIS — Z7983 Long term (current) use of bisphosphonates: Secondary | ICD-10-CM | POA: Diagnosis not present

## 2024-06-27 DIAGNOSIS — R030 Elevated blood-pressure reading, without diagnosis of hypertension: Secondary | ICD-10-CM | POA: Diagnosis not present

## 2024-06-27 DIAGNOSIS — M810 Age-related osteoporosis without current pathological fracture: Secondary | ICD-10-CM | POA: Diagnosis not present

## 2024-08-08 ENCOUNTER — Telehealth: Payer: Self-pay

## 2024-08-08 NOTE — Telephone Encounter (Signed)
 Copied from CRM #[id number]. Topic: Appointments - Scheduling Inquiry for Clinic >> Aug 08, 2024  8:02 AM Delon HERO wrote: Reason for CRM: Patient is calling to schedule AWV Please call before 10:00a.

## 2024-09-28 ENCOUNTER — Ambulatory Visit

## 2024-09-28 VITALS — Ht 66.0 in | Wt 133.0 lb

## 2024-09-28 DIAGNOSIS — Z0000 Encounter for general adult medical examination without abnormal findings: Secondary | ICD-10-CM

## 2024-09-28 NOTE — Patient Instructions (Signed)
 Ms. Tusing,  Thank you for taking the time for your Medicare Wellness Visit. I appreciate your continued commitment to your health goals. Please review the care plan we discussed, and feel free to reach out if I can assist you further.  Please note that Annual Wellness Visits do not include a physical exam. Some assessments may be limited, especially if the visit was conducted virtually. If needed, we may recommend an in-person follow-up with your provider.  Ongoing Care Seeing your primary care provider every 3 to 6 months helps us  monitor your health and provide consistent, personalized care.   Referrals If a referral was made during today's visit and you haven't received any updates within two weeks, please contact the referred provider directly to check on the status.  Recommended Screenings:  Health Maintenance  Topic Date Due   Hepatitis C Screening  Never done   Zoster (Shingles) Vaccine (1 of 2) Never done   COVID-19 Vaccine (3 - 2025-26 season) 05/30/2024   Flu Shot  12/27/2024*   Medicare Annual Wellness Visit  09/28/2025   Pneumococcal Vaccine for age over 50  Completed   Osteoporosis screening with Bone Density Scan  Completed   Meningitis B Vaccine  Aged Out   DTaP/Tdap/Td vaccine  Discontinued   Breast Cancer Screening  Discontinued   Cologuard (Stool DNA test)  Discontinued  *Topic was postponed. The date shown is not the original due date.       09/28/2024    8:22 AM  Advanced Directives  Does Patient Have a Medical Advance Directive? No  Would patient like information on creating a medical advance directive? Yes (MAU/Ambulatory/Procedural Areas - Information given)   Information on Advanced Care Planning can be found at   Secretary of [facility] Advance Health Care Directives Advance Health Care Directives ([email or web address]   Vision: Annual vision screenings are recommended for early detection of glaucoma, cataracts, and diabetic retinopathy. These exams  can also reveal signs of chronic conditions such as diabetes and high blood pressure.  Dental: Annual dental screenings help detect early signs of oral cancer, gum disease, and other conditions linked to overall health, including heart disease and diabetes.  Please see the attached documents for additional preventive care recommendations.

## 2024-09-28 NOTE — Progress Notes (Signed)
 "  Chief Complaint  Patient presents with   Medicare Wellness     Subjective:   Amy Powell is a 78 y.o. female who presents for a Medicare Annual Wellness Visit.  Visit info / Clinical Intake: Medicare Wellness Visit Type:: Subsequent Annual Wellness Visit Persons participating in visit and providing information:: patient Medicare Wellness Visit Mode:: Telephone If telephone:: video declined Since this visit was completed virtually, some vitals may be partially provided or unavailable. Missing vitals are due to the limitations of the virtual format.: Documented vitals are patient reported If Telephone or Video please confirm:: I connected with patient using audio/video enable telemedicine. I verified patient identity with two identifiers, discussed telehealth limitations, and patient agreed to proceed. Patient Location:: home Provider Location:: office Interpreter Needed?: No Pre-visit prep was completed: yes AWV questionnaire completed by patient prior to visit?: no Living arrangements:: (!) lives alone Patient's Overall Health Status Rating: very good Typical amount of pain: some Does pain affect daily life?: no Are you currently prescribed opioids?: no  Dietary Habits and Nutritional Risks How many meals a day?: 3 Eats fruit and vegetables daily?: yes Most meals are obtained by: preparing own meals In the last 2 weeks, have you had any of the following?: none Diabetic:: no  Functional Status Activities of Daily Living (to include ambulation/medication): Independent Ambulation: Independent Medication Administration: Independent Home Management (perform basic housework or laundry): Independent Manage your own finances?: yes Primary transportation is: driving Concerns about vision?: no *vision screening is required for WTM* Concerns about hearing?: no  Fall Screening Falls in the past year?: 0 Number of falls in past year: 0 Was there an injury with Fall?:  0 Fall Risk Category Calculator: 0 Patient Fall Risk Level: Low Fall Risk  Fall Risk Patient at Risk for Falls Due to: No Fall Risks Fall risk Follow up: Falls evaluation completed; Education provided; Falls prevention discussed  Home and Transportation Safety: All rugs have non-skid backing?: yes All stairs or steps have railings?: yes Grab bars in the bathtub or shower?: yes Have non-skid surface in bathtub or shower?: yes Good home lighting?: yes Regular seat belt use?: yes  Cognitive Assessment Difficulty concentrating, remembering, or making decisions? : no Will 6CIT or Mini Cog be Completed: no 6CIT or Mini Cog Declined: patient alert, oriented, able to answer questions appropriately and recall recent events  Advance Directives (For Healthcare) Does Patient Have a Medical Advance Directive?: No Would patient like information on creating a medical advance directive?: Yes (MAU/Ambulatory/Procedural Areas - Information given)  Reviewed/Updated  Reviewed/Updated: Reviewed All (Medical, Surgical, Family, Medications, Allergies, Care Teams, Patient Goals)    Allergies (verified) Penicillins   Current Medications (verified) Outpatient Encounter Medications as of 09/28/2024  Medication Sig   alendronate  (FOSAMAX ) 70 MG tablet Take 1 tablet (70 mg total) by mouth every 7 (seven) days. Take with a full glass of water  on an empty stomach.   ascorbic acid  (VITAMIN C) 500 MG tablet Take 500 mg by mouth daily.   bisacodyl  (DULCOLAX) 5 MG EC tablet Take 5 mg by mouth daily as needed for moderate constipation.   co-enzyme Q-10 30 MG capsule Take 30 mg by mouth 3 (three) times daily.   Multiple Vitamins-Minerals (MULTIVITAMIN WITH MINERALS) tablet Take 1 tablet by mouth daily.   Cholecalciferol (VITAMIN D3) 125 MCG (5000 UT) TBDP Take 5,000 Units by mouth daily at 2 PM. (Patient not taking: Reported on 09/28/2024)   linaclotide  (LINZESS ) 145 MCG CAPS capsule Take 1 capsule (145  mcg  total) by mouth daily before breakfast. (Patient not taking: Reported on 09/28/2024)   senna (SENOKOT) 8.6 MG tablet Take 1 tablet by mouth as needed for constipation. (Patient not taking: Reported on 09/28/2024)   No facility-administered encounter medications on file as of 09/28/2024.    History: Past Medical History:  Diagnosis Date   Cancer (HCC)    colon   CKD (chronic kidney disease) stage 3, GFR 30-59 ml/min (HCC)    HLD (hyperlipidemia)    Osteoporosis    Past Surgical History:  Procedure Laterality Date   ABDOMINAL HYSTERECTOMY     for fibroids, [facility]   COLON SURGERY     LAPAROSCOPIC RIGHT HEMI COLECTOMY Right 11/21/2021   Procedure: LAPAROSCOPIC RIGHT HEMI COLECTOMY, SMALL BOWEL RESECTION;  Surgeon: Teresa Lonni HERO, MD;  Location: [facility];  Service: General;  Laterality: Right;   TONSILLECTOMY     Family History  Problem Relation Age of Onset   Hypertension Mother    Stroke Mother    Diabetes Mother    Alcohol abuse Father    Hypertension Father    Kidney disease Sister    Heart disease Sister    Hypertension Sister    Diabetes Sister    Lung cancer Sister    Heart disease Brother    Heart disease Brother    Heart disease Brother    Breast cancer Neg Hx    Colon cancer Neg Hx    Esophageal cancer Neg Hx    Pancreatic cancer Neg Hx    Stomach cancer Neg Hx    Colon polyps Neg Hx    Rectal cancer Neg Hx    Social History   Occupational History   Not on file  Tobacco Use   Smoking status: Never   Smokeless tobacco: Never  Vaping Use   Vaping status: Never Used  Substance and Sexual Activity   Alcohol use: Yes    Comment: rarely   Drug use: No   Sexual activity: Not Currently    Comment: Works at [employer].   Tobacco Counseling Counseling given: Not Answered  SDOH Screenings   Food Insecurity: No Food Insecurity (09/28/2024)  Housing: Unknown (09/28/2024)  Transportation Needs: No Transportation Needs (09/28/2024)  Utilities: Not At  Risk (09/28/2024)  Alcohol Screen: Low Risk (09/24/2023)  Depression (PHQ2-9): Low Risk (09/28/2024)  Financial Resource Strain: Medium Risk (06/02/2024)  Physical Activity: Sufficiently Active (09/28/2024)  Social Connections: Moderately Integrated (09/28/2024)  Stress: No Stress Concern Present (09/28/2024)  Tobacco Use: Low Risk (09/28/2024)  Health Literacy: Adequate Health Literacy (09/28/2024)   See flowsheets for full screening details  Depression Screen PHQ 2 & 9 Depression Scale- Over the past 2 weeks, how often have you been bothered by any of the following problems? Little interest or pleasure in doing things: 0 Feeling down, depressed, or hopeless (PHQ Adolescent also includes...irritable): 0 PHQ-2 Total Score: 0     Goals Addressed             This Visit's Progress    Maintain health and independence   On track    Prevent falls   On track            Objective:    Today's Vitals   09/28/24 0819  Weight: 133 lb (60.3 kg)  Height: 5' 6 (1.676 m)   Body mass index is 21.47 kg/m.  Hearing/Vision screen Hearing Screening - Comments:: Patient is able to hear conversational tones without difficulty. No issues reported.   Vision Screening -  Comments:: Wears rx glasses - up to date with routine eye exams   Immunizations and Health Maintenance Health Maintenance  Topic Date Due   Hepatitis C Screening  Never done   Zoster Vaccines- Shingrix (1 of 2) Never done   COVID-19 Vaccine (3 - 2025-26 season) 05/30/2024   Influenza Vaccine  12/27/2024 (Originally 04/29/2024)   Medicare Annual Wellness (AWV)  09/28/2025   Pneumococcal Vaccine: 50+ Years  Completed   Bone Density Scan  Completed   Meningococcal B Vaccine  Aged Out   DTaP/Tdap/Td  Discontinued   Mammogram  Discontinued   Fecal DNA (Cologuard)  Discontinued        Assessment/Plan:  This is a routine wellness examination for [facility].  Patient Care Team: Duanne Butler DASEN, MD as PCP - General (Family  Medicine) Teresa Lonni HERO, MD as Consulting Physician (General Surgery)  I have personally reviewed and noted the following in the patients chart:   Medical and social history Use of alcohol, tobacco or illicit drugs  Current medications and supplements including opioid prescriptions. Functional ability and status Nutritional status Physical activity Advanced directives List of other physicians Hospitalizations, surgeries, and ER visits in previous 12 months Vitals Screenings to include cognitive, depression, and falls Referrals and appointments  No orders of the defined types were placed in this encounter.  In addition, I have reviewed and discussed with patient certain preventive protocols, quality metrics, and best practice recommendations. A written personalized care plan for preventive services as well as general preventive health recommendations were provided to patient.   Lavelle Charmaine Browner, LPN   [id number]   Return in 1 year (on 09/28/2025).  After Visit Summary: (MyChart) Due to this being a telephonic visit, the after visit summary with patients personalized plan was offered to patient via MyChart   Nurse Notes: No voiced or noted concerns at this time  "

## 2024-11-10 ENCOUNTER — Ambulatory Visit: Admitting: Family Medicine
